# Patient Record
Sex: Male | Born: 1935 | Race: Black or African American | Hispanic: No | Marital: Single | Smoking: Current every day smoker
Health system: Southern US, Community
[De-identification: ages and names within clinical notes are randomized; demographics above are authoritative.]

## PROBLEM LIST (undated history)

## (undated) DIAGNOSIS — I10 Essential (primary) hypertension: Secondary | ICD-10-CM

## (undated) DIAGNOSIS — R55 Syncope and collapse: Secondary | ICD-10-CM

## (undated) DIAGNOSIS — D649 Anemia, unspecified: Secondary | ICD-10-CM

## (undated) DIAGNOSIS — H547 Unspecified visual loss: Secondary | ICD-10-CM

## (undated) DIAGNOSIS — C61 Malignant neoplasm of prostate: Secondary | ICD-10-CM

## (undated) DIAGNOSIS — R918 Other nonspecific abnormal finding of lung field: Secondary | ICD-10-CM

## (undated) DIAGNOSIS — F0390 Unspecified dementia without behavioral disturbance: Secondary | ICD-10-CM

## (undated) DIAGNOSIS — K922 Gastrointestinal hemorrhage, unspecified: Secondary | ICD-10-CM

## (undated) DIAGNOSIS — M6282 Rhabdomyolysis: Secondary | ICD-10-CM

## (undated) DIAGNOSIS — I5032 Chronic diastolic (congestive) heart failure: Secondary | ICD-10-CM

## (undated) DIAGNOSIS — I739 Peripheral vascular disease, unspecified: Secondary | ICD-10-CM

## (undated) DIAGNOSIS — K2970 Gastritis, unspecified, without bleeding: Secondary | ICD-10-CM

## (undated) DIAGNOSIS — N390 Urinary tract infection, site not specified: Secondary | ICD-10-CM

## (undated) DIAGNOSIS — H269 Unspecified cataract: Secondary | ICD-10-CM

## (undated) DIAGNOSIS — Z22322 Carrier or suspected carrier of Methicillin resistant Staphylococcus aureus: Secondary | ICD-10-CM

## (undated) HISTORY — PX: ABDOMINAL SURGERY: SHX537

## (undated) HISTORY — DX: Malignant neoplasm of prostate: C61

## (undated) HISTORY — DX: Anemia, unspecified: D64.9

## (undated) HISTORY — DX: Gastrointestinal hemorrhage, unspecified: K92.2

---

## 2007-12-21 ENCOUNTER — Emergency Department (HOSPITAL_COMMUNITY): Admission: EM | Admit: 2007-12-21 | Discharge: 2007-12-21 | Payer: Self-pay | Admitting: Emergency Medicine

## 2008-08-27 ENCOUNTER — Emergency Department (HOSPITAL_COMMUNITY): Admission: EM | Admit: 2008-08-27 | Discharge: 2008-08-27 | Payer: Self-pay | Admitting: Emergency Medicine

## 2011-08-03 ENCOUNTER — Inpatient Hospital Stay (HOSPITAL_COMMUNITY)
Admission: EM | Admit: 2011-08-03 | Discharge: 2011-08-11 | DRG: 378 | Disposition: A | Discharge status: Nursing Home (Includes ICF) | Payer: Medicare Other | Attending: Family Medicine | Admitting: Family Medicine

## 2011-08-03 ENCOUNTER — Encounter (HOSPITAL_COMMUNITY): Payer: Self-pay | Admitting: Emergency Medicine

## 2011-08-03 DIAGNOSIS — E1169 Type 2 diabetes mellitus with other specified complication: Secondary | ICD-10-CM | POA: Diagnosis present

## 2011-08-03 DIAGNOSIS — F0390 Unspecified dementia without behavioral disturbance: Secondary | ICD-10-CM | POA: Diagnosis present

## 2011-08-03 DIAGNOSIS — K922 Gastrointestinal hemorrhage, unspecified: Secondary | ICD-10-CM | POA: Diagnosis present

## 2011-08-03 DIAGNOSIS — I10 Essential (primary) hypertension: Secondary | ICD-10-CM | POA: Diagnosis present

## 2011-08-03 DIAGNOSIS — E876 Hypokalemia: Secondary | ICD-10-CM | POA: Diagnosis present

## 2011-08-03 DIAGNOSIS — E871 Hypo-osmolality and hyponatremia: Secondary | ICD-10-CM | POA: Diagnosis present

## 2011-08-03 DIAGNOSIS — E119 Type 2 diabetes mellitus without complications: Secondary | ICD-10-CM | POA: Diagnosis present

## 2011-08-03 DIAGNOSIS — N400 Enlarged prostate without lower urinary tract symptoms: Secondary | ICD-10-CM | POA: Diagnosis present

## 2011-08-03 DIAGNOSIS — D62 Acute posthemorrhagic anemia: Secondary | ICD-10-CM | POA: Diagnosis present

## 2011-08-03 DIAGNOSIS — E162 Hypoglycemia, unspecified: Secondary | ICD-10-CM

## 2011-08-03 DIAGNOSIS — H5440 Blindness, one eye, unspecified eye: Secondary | ICD-10-CM | POA: Diagnosis present

## 2011-08-03 DIAGNOSIS — R4182 Altered mental status, unspecified: Secondary | ICD-10-CM | POA: Diagnosis present

## 2011-08-03 DIAGNOSIS — K921 Melena: Principal | ICD-10-CM | POA: Diagnosis present

## 2011-08-03 DIAGNOSIS — D649 Anemia, unspecified: Secondary | ICD-10-CM

## 2011-08-03 HISTORY — DX: Urinary tract infection, site not specified: N39.0

## 2011-08-03 HISTORY — DX: Essential (primary) hypertension: I10

## 2011-08-03 HISTORY — DX: Unspecified dementia, unspecified severity, without behavioral disturbance, psychotic disturbance, mood disturbance, and anxiety: F03.90

## 2011-08-03 HISTORY — DX: Unspecified visual loss: H54.7

## 2011-08-03 HISTORY — DX: Unspecified cataract: H26.9

## 2011-08-03 HISTORY — DX: Gastritis, unspecified, without bleeding: K29.70

## 2011-08-03 HISTORY — DX: Peripheral vascular disease, unspecified: I73.9

## 2011-08-03 HISTORY — DX: Rhabdomyolysis: M62.82

## 2011-08-03 LAB — CBC
HCT: 20.4 % — ABNORMAL LOW (ref 39.0–52.0)
Hemoglobin: 6.3 g/dL — CL (ref 13.0–17.0)
MCH: 22.2 pg — ABNORMAL LOW (ref 26.0–34.0)
MCHC: 30.9 g/dL (ref 30.0–36.0)
MCV: 71.8 fL — ABNORMAL LOW (ref 78.0–100.0)
RDW: 15.1 % (ref 11.5–15.5)

## 2011-08-03 LAB — GLUCOSE, CAPILLARY

## 2011-08-03 LAB — BASIC METABOLIC PANEL
BUN: 15 mg/dL (ref 6–23)
Calcium: 9 mg/dL (ref 8.4–10.5)
Creatinine, Ser: 0.99 mg/dL (ref 0.50–1.35)
GFR calc non Af Amer: 72 mL/min — ABNORMAL LOW (ref >90)
Glucose, Bld: 71 mg/dL (ref 70–99)

## 2011-08-03 LAB — PREPARE RBC (CROSSMATCH)

## 2011-08-03 LAB — RETICULOCYTES: Retic Ct Pct: 1.8 % (ref 0.4–3.1)

## 2011-08-03 MED ORDER — SODIUM CHLORIDE 0.9 % IV BOLUS (SEPSIS)
1000.0000 mL | Freq: Once | INTRAVENOUS | Status: AC
  Administered 2011-08-03: 1000 mL via INTRAVENOUS

## 2011-08-03 MED ORDER — DEXTROSE 5 % IV SOLN
INTRAVENOUS | Status: DC
  Administered 2011-08-03: via INTRAVENOUS

## 2011-08-03 MED ORDER — DEXTROSE 50 % IV SOLN
1.0000 | Freq: Once | INTRAVENOUS | Status: DC
  Filled 2011-08-03: qty 50

## 2011-08-03 MED ORDER — DEXTROSE 50 % IV SOLN
INTRAVENOUS | Status: AC
  Filled 2011-08-03: qty 50

## 2011-08-03 MED ORDER — DEXTROSE 50 % IV SOLN
INTRAVENOUS | Status: AC
  Administered 2011-08-03: 50 mL
  Filled 2011-08-03: qty 50

## 2011-08-03 NOTE — ED Notes (Signed)
Blood transfusion begun.

## 2011-08-03 NOTE — ED Notes (Signed)
Pt. From [facility] was found by EMS with BS of 36.  Pt. Was struggling to get up when staff found him but no one witnessed a fall and patient is having no pain and no evidence of trauma.  EMS gave him 25G of D50.  Pt. Was rechecked and BS was 90.  Pt. More alert according to EMS.

## 2011-08-03 NOTE — ED Provider Notes (Signed)
History     CSN: [id number]  Arrival date & time 08/03/11  1805   First MD Initiated Contact with Patient 08/03/11 1808      Chief Complaint  Patient presents with  . Hypoglycemia    (Consider location/radiation/quality/duration/timing/severity/associated sxs/prior treatment) The history is provided by the patient.   (LEVEL V CAVEAT: dementia) Sent from nursing home for hypoglycemia. On metformin. Ate food today. Does report dark stools x 5-7 days. No abd pain. No vomiting.  No diarrhea. Some SOB over the past several days. No chest pain. No other complaints  Past Medical History  Diagnosis Date  . Hypertension   . Prostate neoplasm   . Angiospasm   . Diabetes mellitus   . Schizoaffective disorder   . Dementia   . Cataracts, bilateral   . Blindness   . Rhabdomyolysis   . Gastritis   . UTI (lower urinary tract infection)     No past surgical history on file.  No family history on file.  History  Substance Use Topics  . Smoking status: Not on file  . Smokeless tobacco: Not on file  . Alcohol Use: No      Review of Systems  Unable to perform ROS   Allergies  Review of patient's allergies indicates no known allergies.  Home Medications   Current Outpatient Rx  Name Route Sig Dispense Refill  . ACETAMINOPHEN 500 MG PO TABS Oral Take 500 mg by mouth 3 (three) times daily.    . ASPIRIN EC 81 MG PO TBEC Oral Take 81 mg by mouth every morning.    Marland Kitchen BUPROPION HCL ER (SR) 150 MG PO TB12 Oral Take 150 mg by mouth every morning.    Marland Kitchen VITAMIN D3 1000 UNITS PO TABS Oral Take 1,000 Units by mouth daily.    Marland Kitchen FINASTERIDE 5 MG PO TABS Oral Take 5 mg by mouth daily. *Do not crush.*    . FLUTICASONE PROPIONATE 50 MCG/ACT NA SUSP Nasal Place 1 spray into the nose every morning.    Marland Kitchen GLYBURIDE 5 MG PO TABS Oral Take 5 mg by mouth daily with breakfast.    . HYDROCERIN EX CREA Topical Apply 1 application topically 2 (two) times daily. To legs and feet. (May keep at bedside.)     . LISINOPRIL-HYDROCHLOROTHIAZIDE 20-12.5 MG PO TABS Oral Take 1 tablet by mouth daily.    . ADULT MULTIVITAMIN W/MINERALS CH Oral Take 1 tablet by mouth every morning.    Marland Kitchen OMEPRAZOLE 40 MG PO CPDR Oral Take 40 mg by mouth every morning.    Marland Kitchen TERAZOSIN HCL 1 MG PO CAPS Oral Take 1 mg by mouth at bedtime.      BP 151/46  Pulse 79  Temp(Src) 98.4 F (36.9 C) (Oral)  Resp 16  SpO2 100%  Physical Exam  Nursing note and vitals reviewed. Constitutional: He is oriented to person, place, and time. He appears well-developed and well-nourished.  HENT:  Head: Normocephalic and atraumatic.  Eyes: EOM are normal.  Neck: Normal range of motion.  Cardiovascular: Normal rate, regular rhythm, normal heart sounds and intact distal pulses.   Pulmonary/Chest: Effort normal and breath sounds normal. No respiratory distress.  Abdominal: Soft. He exhibits no distension. There is no tenderness.  Genitourinary:       Dark brown stool. Normal tone  Musculoskeletal: Normal range of motion.  Neurological: He is alert and oriented to person, place, and time.  Skin: Skin is warm and dry.  Psychiatric: He has a normal  mood and affect. Judgment normal.    ED Course  Procedures (including critical care time)  Labs Reviewed  CBC - Abnormal; Notable for the following:    RBC 2.84 (*)    Hemoglobin 6.3 (*)    HCT 20.4 (*)    MCV 71.8 (*)    MCH 22.2 (*)    All other components within normal limits  BASIC METABOLIC PANEL - Abnormal; Notable for the following:    Sodium 117 (*)    Chloride 84 (*)    GFR calc non Af Amer 72 (*)    GFR calc Af Amer 83 (*)    All other components within normal limits  RETICULOCYTES - Abnormal; Notable for the following:    RBC. 2.80 (*)    All other components within normal limits  GLUCOSE, CAPILLARY - Abnormal; Notable for the following:    Glucose-Capillary 47 (*)    All other components within normal limits  TYPE AND SCREEN  PREPARE RBC (CROSSMATCH)  OCCULT  BLOOD, POC DEVICE  ABO/RH  VITAMIN B12  FOLATE  IRON AND TIBC  FERRITIN   No results found.   1. Hypoglycemia   2. Symptomatic anemia    MDM  Hypoglycemia. Noted to have hgb of 6.3. Some SOB. Will transfuse. No prior hgb's available. Anemia panel sent. No chest pain at this time. Heme positive stools. Spoke with Hospitalist, he will admit. Possible slow UGI bleed        Lyanne Co, MD 08/03/11 2147

## 2011-08-03 NOTE — ED Notes (Signed)
ZOX:[id number]<AV> Expected date:08/03/11<BR> Expected time: 5:56 PM<BR> Means of arrival:Ambulance<BR> Comments:<BR> M90. 88 M. GLF. No injuries or complaint. Stable. 15 mins

## 2011-08-03 NOTE — ED Notes (Signed)
Gave pt ham sandwich and orange juice per RN request

## 2011-08-03 NOTE — ED Notes (Signed)
Critical values reported to Dr Jennefer Bravo. Pt informed of plan of care.

## 2011-08-03 NOTE — ED Notes (Signed)
First contact with pt. Pt alert to person and place. Pt ate complete sandwich. Denies complaints at this time.

## 2011-08-03 NOTE — H&P (Signed)
Michael Yang is an 78 y.o. male.   PCP - Unknown.  Most of the history is obtained from ER physician Dr. Patria Mane. Patient is demented and cannot provide any history. Unable to reach family with the number provided in Epic. Chief Complaint: Hypoglycemia. HPI: 78 year old male with history of hypertension, diabetes mellitus2, dementia, blindness in his right eye prostate cancer was brought from nursing home in [place] due to hypoglycemia. As per the ER physician patient's CBG at the nursing home was around 30. Patient was given something to eat and by the time he reached ER blood sugars were around 72-100. Patient was asymptomatic. While in the ER basic labs done show patient is profoundly anemic with hemoglobin around 6.2 and also hyponatremic sodium around 117. We do not have any old labs to compare with. Stool for occult blood is positive. Patient's blood indices show microcytic hypochromic picture. Patient is also on aspirin. Patient does not provide any history but are specifically questioned if he is hurting anywhere he is stating 'no'. He is not in any acute distress. Patient will be admitted for further management.  Past Medical History  Diagnosis Date  . Hypertension   . Prostate neoplasm   . Angiospasm   . Diabetes mellitus   . Schizoaffective disorder   . Dementia   . Cataracts, bilateral   . Blindness   . Rhabdomyolysis   . Gastritis   . UTI (lower urinary tract infection)     History reviewed. No pertinent past surgical history.  History reviewed. No pertinent family history. Social History:  does not have a smoking history on file. He does not have any smokeless tobacco history on file. He reports that he does not drink alcohol. His drug history not on file.  Allergies: No Known Allergies   (Not in a hospital admission)  Results for orders placed during the hospital encounter of 08/03/11 (from the past 48 hour(s))  CBC     Status: Abnormal   Collection Time   08/03/11   7:15 PM      Component Value Range Comment   WBC 10.5  4.0 - 10.5 (K/uL)    RBC 2.84 (*) 4.22 - 5.81 (MIL/uL)    Hemoglobin 6.3 (*) 13.0 - 17.0 (g/dL)    HCT 16.1 (*) 09.6 - 52.0 (%)    MCV 71.8 (*) 78.0 - 100.0 (fL)    MCH 22.2 (*) 26.0 - 34.0 (pg)    MCHC 30.9  30.0 - 36.0 (g/dL)    RDW 04.5  40.9 - 81.1 (%)    Platelets 393  150 - 400 (K/uL)   BASIC METABOLIC PANEL     Status: Abnormal   Collection Time   08/03/11  7:15 PM      Component Value Range Comment   Sodium 117 (*) 135 - 145 (mEq/L)    Potassium 4.4  3.5 - 5.1 (mEq/L)    Chloride 84 (*) 96 - 112 (mEq/L)    CO2 23  19 - 32 (mEq/L)    Glucose, Bld 71  70 - 99 (mg/dL)    BUN 15  6 - 23 (mg/dL)    Creatinine, Ser 9.14  0.50 - 1.35 (mg/dL)    Calcium 9.0  8.4 - 10.5 (mg/dL)    GFR calc non Af Amer 72 (*) >90 (mL/min)    GFR calc Af Amer 83 (*) >90 (mL/min)   OCCULT BLOOD, POC DEVICE     Status: Normal   Collection Time   08/03/11  8:37 PM      Component Value Range Comment   Fecal Occult Bld POSITIVE     TYPE AND SCREEN     Status: Normal (Preliminary result)   Collection Time   08/03/11  8:42 PM      Component Value Range Comment   ABO/RH(D) O POS      Antibody Screen NEG      Sample Expiration 08/06/2011      Unit Number [id number]      Blood Component Type RBC LR PHER1      Unit division 00      Status of Unit ISSUED      Transfusion Status OK TO TRANSFUSE      Crossmatch Result Compatible      Unit Number [id number]      Blood Component Type RBC LR PHER1      Unit division 00      Status of Unit ALLOCATED      Transfusion Status OK TO TRANSFUSE      Crossmatch Result Compatible     RETICULOCYTES     Status: Abnormal   Collection Time   08/03/11  8:42 PM      Component Value Range Comment   Retic Ct Pct 1.8  0.4 - 3.1 (%)    RBC. 2.80 (*) 4.22 - 5.81 (MIL/uL)    Retic Count, Manual 50.4  19.0 - 186.0 (K/uL)   PREPARE RBC (CROSSMATCH)     Status: Normal   Collection Time   08/03/11  8:45 PM      Component  Value Range Comment   Order Confirmation ORDER PROCESSED BY BLOOD BANK     ABO/RH     Status: Normal (Preliminary result)   Collection Time   08/03/11  8:53 PM      Component Value Range Comment   ABO/RH(D) O POS     GLUCOSE, CAPILLARY     Status: Abnormal   Collection Time   08/03/11  9:41 PM      Component Value Range Comment   Glucose-Capillary 47 (*) 70 - 99 (mg/dL)    Comment 1 Documented in Chart      Comment 2 Notify RN      No results found.  Review of Systems  Constitutional: Negative.   HENT: Negative.   Eyes: Negative.   Respiratory: Negative.   Cardiovascular: Negative.   Gastrointestinal: Negative.   Genitourinary: Negative.   Musculoskeletal: Negative.   Skin: Negative.   Neurological: Negative.   Endo/Heme/Allergies: Negative.   Psychiatric/Behavioral: Negative.     Blood pressure 143/59, pulse 79, temperature 98.3 F (36.8 C), temperature source Oral, resp. rate 18, SpO2 100.00%. Physical Exam  Constitutional: He appears well-developed and well-nourished. No distress.  HENT:  Head: Normocephalic and atraumatic.  Right Ear: External ear normal.  Left Ear: External ear normal.  Nose: Nose normal.  Mouth/Throat: Oropharynx is clear and moist.  Eyes:       Right cornea is opaque.  Neck: Normal range of motion. Neck supple.  Cardiovascular: Normal rate and regular rhythm.   Respiratory: Effort normal and breath sounds normal. No respiratory distress. He has no wheezes. He has no rales.  GI: Soft. Bowel sounds are normal. He exhibits no distension. There is no tenderness. There is no rebound.  Musculoskeletal: Normal range of motion. He exhibits no edema and no tenderness.  Neurological: He is alert.       Oriented to his name only.  Skin: Skin is warm and  dry. No rash noted. He is not diaphoretic. No erythema.     Assessment/Plan #1. Microcytic hypochromic anemia with GI bleed - reviewing patient's blood indices it shows microcytic picture thus his  anemia may be chronic. At this time the ER physician has already ordered 2 units of packed blood cells to be transfused. His stool was brown. Add protonix and recheck CBC after transfusion. Consult GI in a.m. #2. Severe hyponatremia - the causes are clear. Patient is on hydrochlorothiazide which we will discontinue. We will send urine sodium, osmolality both urine and plasma, TSH, cortisol level, check chest x-ray. For now gently hydrate with normal saline. We will have further plans based on his frequent metabolic panel checks and based on his urine sodium and other studies. #3. Hypoglycemia in a patient with diabetes mellitus2 on glyburide - DC glyburide. For now until morning we will check CBG every 2 hours. Based on this if there is no further episodes of hypoglycemia we will change it to a.c. and at bedtime. Check hemoglobin A1c. #4. Dementia - probably patient is at baseline but need to confirm with family when available. #5. History of hypertension - continue lisinopril but hold off HCTZ because of hyponatremia. #6. History of cancer prostate. #7. BPH.  CODE STATUS - full code.  Eduard Clos 08/03/2011, 11:23 PM

## 2011-08-04 ENCOUNTER — Inpatient Hospital Stay (HOSPITAL_COMMUNITY): Payer: Medicare Other

## 2011-08-04 ENCOUNTER — Encounter (HOSPITAL_COMMUNITY): Payer: Self-pay | Admitting: *Deleted

## 2011-08-04 DIAGNOSIS — D649 Anemia, unspecified: Secondary | ICD-10-CM

## 2011-08-04 DIAGNOSIS — K922 Gastrointestinal hemorrhage, unspecified: Secondary | ICD-10-CM

## 2011-08-04 DIAGNOSIS — E162 Hypoglycemia, unspecified: Secondary | ICD-10-CM

## 2011-08-04 LAB — BASIC METABOLIC PANEL
BUN: 10 mg/dL (ref 6–23)
CO2: 21 mEq/L (ref 19–32)
Calcium: 8.6 mg/dL (ref 8.4–10.5)
Chloride: 83 mEq/L — ABNORMAL LOW (ref 96–112)
Chloride: 84 mEq/L — ABNORMAL LOW (ref 96–112)
GFR calc Af Amer: 87 mL/min — ABNORMAL LOW (ref >90)
GFR calc Af Amer: 87 mL/min — ABNORMAL LOW (ref >90)
GFR calc non Af Amer: 75 mL/min — ABNORMAL LOW (ref >90)
GFR calc non Af Amer: 75 mL/min — ABNORMAL LOW (ref >90)
GFR calc non Af Amer: 75 mL/min — ABNORMAL LOW (ref >90)
Glucose, Bld: 105 mg/dL — ABNORMAL HIGH (ref 70–99)
Glucose, Bld: 124 mg/dL — ABNORMAL HIGH (ref 70–99)
Potassium: 3.9 mEq/L (ref 3.5–5.1)
Potassium: 4.1 mEq/L (ref 3.5–5.1)
Potassium: 4.1 mEq/L (ref 3.5–5.1)
Sodium: 117 mEq/L — CL (ref 135–145)
Sodium: 116 mEq/L — CL (ref 135–145)
Sodium: 114 mEq/L — CL (ref 135–145)

## 2011-08-04 LAB — GLUCOSE, CAPILLARY
Glucose-Capillary: 150 mg/dL — ABNORMAL HIGH (ref 70–99)
Glucose-Capillary: 74 mg/dL (ref 70–99)
Glucose-Capillary: 128 mg/dL — ABNORMAL HIGH (ref 70–99)

## 2011-08-04 LAB — CBC
HCT: 23.5 % — ABNORMAL LOW (ref 39.0–52.0)
Hemoglobin: 7.6 g/dL — ABNORMAL LOW (ref 13.0–17.0)
MCH: 24.1 pg — ABNORMAL LOW (ref 26.0–34.0)
MCHC: 32.8 g/dL (ref 30.0–36.0)
MCV: 73.7 fL — ABNORMAL LOW (ref 78.0–100.0)
Platelets: 370 10*3/uL (ref 150–400)
RBC: 3.19 MIL/uL — ABNORMAL LOW (ref 4.22–5.81)
RDW: 16.4 % — ABNORMAL HIGH (ref 11.5–15.5)
WBC: 11.5 10*3/uL — ABNORMAL HIGH (ref 4.0–10.5)

## 2011-08-04 LAB — IRON AND TIBC

## 2011-08-04 LAB — MRSA PCR SCREENING: MRSA by PCR: POSITIVE — AB

## 2011-08-04 LAB — HEMOGLOBIN A1C
Hgb A1c MFr Bld: 4.4 % (ref <5.7)
Mean Plasma Glucose: 80 mg/dL (ref <117)

## 2011-08-04 LAB — FERRITIN: Ferritin: 7 ng/mL — ABNORMAL LOW (ref 22–322)

## 2011-08-04 LAB — CORTISOL: Cortisol, Plasma: 27.6 ug/dL

## 2011-08-04 MED ORDER — MUPIROCIN 2 % EX OINT
1.0000 "application " | TOPICAL_OINTMENT | Freq: Two times a day (BID) | CUTANEOUS | Status: AC
  Administered 2011-08-04 – 2011-08-08 (×10): 1 via NASAL
  Filled 2011-08-04 (×2): qty 22

## 2011-08-04 MED ORDER — PANTOPRAZOLE SODIUM 40 MG PO TBEC: 40.0000 mg | DELAYED_RELEASE_TABLET | Freq: Every day | ORAL | Status: DC

## 2011-08-04 MED ORDER — BUPROPION HCL ER (SR) 150 MG PO TB12
150.0000 mg | ORAL_TABLET | Freq: Every morning | ORAL | Status: DC
  Administered 2011-08-04 – 2011-08-06 (×3): 150 mg via ORAL
  Filled 2011-08-04 (×3): qty 1

## 2011-08-04 MED ORDER — SODIUM CHLORIDE 3 % IV SOLN
INTRAVENOUS | Status: DC
  Administered 2011-08-04: 23:00:00 via INTRAVENOUS
  Administered 2011-08-05: 30 mL/h via INTRAVENOUS
  Filled 2011-08-04 (×3): qty 500

## 2011-08-04 MED ORDER — SODIUM CHLORIDE 0.9 % IV SOLN
INTRAVENOUS | Status: DC
  Administered 2011-08-04: 09:00:00 via INTRAVENOUS

## 2011-08-04 MED ORDER — PANTOPRAZOLE SODIUM 40 MG IV SOLR
40.0000 mg | Freq: Two times a day (BID) | INTRAVENOUS | Status: DC
  Administered 2011-08-04 – 2011-08-07 (×8): 40 mg via INTRAVENOUS
  Filled 2011-08-04 (×9): qty 40

## 2011-08-04 MED ORDER — HYDROCERIN EX CREA
1.0000 "application " | TOPICAL_CREAM | Freq: Two times a day (BID) | CUTANEOUS | Status: DC
  Administered 2011-08-04 – 2011-08-11 (×14): 1 via TOPICAL
  Filled 2011-08-04: qty 113

## 2011-08-04 MED ORDER — CHLORHEXIDINE GLUCONATE CLOTH 2 % EX PADS
6.0000 | MEDICATED_PAD | Freq: Every day | CUTANEOUS | Status: AC
  Administered 2011-08-04 – 2011-08-08 (×5): 6 via TOPICAL

## 2011-08-04 MED ORDER — FINASTERIDE 5 MG PO TABS
5.0000 mg | ORAL_TABLET | Freq: Every day | ORAL | Status: DC
  Administered 2011-08-04 – 2011-08-11 (×8): 5 mg via ORAL
  Filled 2011-08-04 (×8): qty 1

## 2011-08-04 MED ORDER — LISINOPRIL 20 MG PO TABS
20.0000 mg | ORAL_TABLET | Freq: Every day | ORAL | Status: DC
  Administered 2011-08-04 – 2011-08-05 (×2): 20 mg via ORAL
  Filled 2011-08-04 (×2): qty 1

## 2011-08-04 MED ORDER — ACETAMINOPHEN 650 MG RE SUPP: 650.0000 mg | Freq: Four times a day (QID) | RECTAL | Status: DC | PRN

## 2011-08-04 MED ORDER — DEXTROSE 5 % IV SOLN
INTRAVENOUS | Status: AC
  Administered 2011-08-04: via INTRAVENOUS

## 2011-08-04 MED ORDER — PNEUMOCOCCAL VAC POLYVALENT 25 MCG/0.5ML IJ INJ
0.5000 mL | INJECTION | INTRAMUSCULAR | Status: AC
  Administered 2011-08-04: 0.5 mL via INTRAMUSCULAR
  Filled 2011-08-04: qty 0.5

## 2011-08-04 MED ORDER — ONDANSETRON HCL 4 MG/2ML IJ SOLN
4.0000 mg | Freq: Four times a day (QID) | INTRAMUSCULAR | Status: DC | PRN
  Filled 2011-08-04: qty 2

## 2011-08-04 MED ORDER — ACETAMINOPHEN 325 MG PO TABS: 650.0000 mg | ORAL_TABLET | Freq: Four times a day (QID) | ORAL | Status: DC | PRN

## 2011-08-04 MED ORDER — FLUTICASONE PROPIONATE 50 MCG/ACT NA SUSP
1.0000 | Freq: Every morning | NASAL | Status: DC
  Administered 2011-08-04 – 2011-08-11 (×8): 1 via NASAL
  Filled 2011-08-04: qty 16

## 2011-08-04 MED ORDER — SODIUM CHLORIDE 0.9 % IJ SOLN
3.0000 mL | Freq: Two times a day (BID) | INTRAMUSCULAR | Status: DC
  Administered 2011-08-04: 3 mL via INTRAVENOUS
  Administered 2011-08-05: 10:00:00 via INTRAVENOUS
  Administered 2011-08-06 – 2011-08-11 (×10): 3 mL via INTRAVENOUS

## 2011-08-04 MED ORDER — BIOTENE DRY MOUTH MT LIQD
15.0000 mL | Freq: Two times a day (BID) | OROMUCOSAL | Status: DC
Start: 2011-08-04 — End: 2011-08-11
  Administered 2011-08-04 – 2011-08-11 (×13): 15 mL via OROMUCOSAL

## 2011-08-04 MED ORDER — TERAZOSIN HCL 1 MG PO CAPS
1.0000 mg | ORAL_CAPSULE | Freq: Every day | ORAL | Status: DC
  Administered 2011-08-04 – 2011-08-10 (×8): 1 mg via ORAL
  Filled 2011-08-04 (×10): qty 1

## 2011-08-04 MED ORDER — ADULT MULTIVITAMIN W/MINERALS CH
1.0000 | ORAL_TABLET | Freq: Every morning | ORAL | Status: DC
  Administered 2011-08-04 – 2011-08-11 (×8): 1 via ORAL
  Filled 2011-08-04 (×8): qty 1

## 2011-08-04 MED ORDER — INSULIN ASPART 100 UNIT/ML ~~LOC~~ SOLN
0.0000 [IU] | Freq: Three times a day (TID) | SUBCUTANEOUS | Status: DC
Start: 2011-08-04 — End: 2011-08-11
  Administered 2011-08-05: 1 [IU] via SUBCUTANEOUS
  Administered 2011-08-06: 2 [IU] via SUBCUTANEOUS
  Administered 2011-08-10: 1 [IU] via SUBCUTANEOUS

## 2011-08-04 MED ORDER — ONDANSETRON HCL 4 MG PO TABS: 4.0000 mg | ORAL_TABLET | Freq: Four times a day (QID) | ORAL | Status: DC | PRN

## 2011-08-04 NOTE — Progress Notes (Signed)
CRITICAL VALUE ALERT  Critical value received:  Na+ 116 and Urine osmol 244  Date of notification:  08/04/11  Time of notification:  1100  Critical value read back:yes  Nurse who received alert:  K. Vear Clock RN   MD notified (1st page):  Aurelio Brash NP  Time of first page:  On unit 1105  MD notified (2nd page):  Time of second page:  Responding MD:  Aurelio Brash NP  Time MD responded:  [phone]

## 2011-08-04 NOTE — Consult Note (Signed)
Primary Care Physician:   Primary Gastroenterologist:  none  Reason for Consultation:  Microcytic iron deficiency anemia with Hemoccult positive stool  HPI: Michael Yang is a 78 y.o. male who was brought to the emergency room last evening from a nursing facility called [facility] with a glucose of 30. He had been given dextrose in the ambulance and on arrival glucose was within normal range. Patient had baseline labs done in the emergency room however that showed a sodium of 117, and a hemoglobin of 6.2 MCV of 71.8. He was subsequently admitted for further medical management.  Patient has history of dementia, hypertension adult onset diabetes and apparently has prior diagnosis of a schizoaffective disorder and prostate cancer. Patient is appropriate but he is a poor historian. He has no GI complaints currently, specifically denies any abdominal pain nausea vomiting dysphasia. He has been on aware of any melena or hematochezia. He has a midline incisional scar on his abdomen and says that he had surgery at the [facility] in [place] many years ago but is uncertain what this was done for. He cannot tell me whether he has had prior colonoscopy or endoscopy says that he had received care in the past through the [facility].Marland Kitchen  Iron studies have been done showing an iron less than 10 ferritin of 7. He was transfused 2 units of packed RBCs and hemoglobin today is 7.7 his sodium is now 116.  Patient was not on any blood thinners or aspirin prior to admission.   Past Medical History  Diagnosis Date  . Hypertension   . Prostate neoplasm   . Angiospasm   . Diabetes mellitus   . Schizoaffective disorder   . Dementia   . Cataracts, bilateral   . Blindness   . Rhabdomyolysis   . Gastritis   . UTI (lower urinary tract infection)     History reviewed. No pertinent past surgical history.  Prior to Admission medications   Medication Sig Start Date End Date Taking? Authorizing Provider  acetaminophen  (TYLENOL) 500 MG tablet Take 500 mg by mouth 3 (three) times daily.   Yes Historical Provider, MD  aspirin EC 81 MG tablet Take 81 mg by mouth every morning.   Yes Historical Provider, MD  buPROPion (WELLBUTRIN SR) 150 MG 12 hr tablet Take 150 mg by mouth every morning.   Yes Historical Provider, MD  cholecalciferol (VITAMIN D) 1000 UNITS tablet Take 1,000 Units by mouth daily.   Yes Historical Provider, MD  finasteride (PROSCAR) 5 MG tablet Take 5 mg by mouth daily. *Do not crush.*   Yes Historical Provider, MD  fluticasone (FLONASE) 50 MCG/ACT nasal spray Place 1 spray into the nose every morning.   Yes Historical Provider, MD  glyBURIDE (DIABETA) 5 MG tablet Take 5 mg by mouth daily with breakfast.   Yes Historical Provider, MD  hydrocerin (EUCERIN) CREA Apply 1 application topically 2 (two) times daily. To legs and feet. (May keep at bedside.)   Yes Historical Provider, MD  lisinopril-hydrochlorothiazide (PRINZIDE,ZESTORETIC) 20-12.5 MG per tablet Take 1 tablet by mouth daily.   Yes Historical Provider, MD  Multiple Vitamin (MULITIVITAMIN WITH MINERALS) TABS Take 1 tablet by mouth every morning.   Yes Historical Provider, MD  omeprazole (PRILOSEC) 40 MG capsule Take 40 mg by mouth every morning.   Yes Historical Provider, MD  terazosin (HYTRIN) 1 MG capsule Take 1 mg by mouth at bedtime.   Yes Historical Provider, MD    Current Facility-Administered Medications  Medication Dose  Route Frequency Provider Last Rate Last Dose  . 0.9 %  sodium chloride infusion   Intravenous Continuous Eduard Clos, MD 75 mL/hr at 08/04/11 0908    . acetaminophen (TYLENOL) tablet 650 mg  650 mg Oral Q6H PRN Eduard Clos, MD       Or  . acetaminophen (TYLENOL) suppository 650 mg  650 mg Rectal Q6H PRN Eduard Clos, MD      . antiseptic oral rinse (BIOTENE) solution 15 mL  15 mL Mouth Rinse BID Eduard Clos, MD   15 mL at 08/04/11 1041  . buPROPion [facility] SR) 12 hr tablet 150 mg   150 mg Oral q morning - 10a Eduard Clos, MD   150 mg at 08/04/11 1042  . Chlorhexidine Gluconate Cloth 2 % PADS 6 each  6 each Topical Daily Eduard Clos, MD   6 each at 08/04/11 1041  . dextrose 5 % solution   Intravenous STAT Lyanne Co, MD 100 mL/hr at 08/04/11 0001    . dextrose 50 % solution 50 mL  1 ampule Intravenous Once Lyanne Co, MD      . dextrose 50 % solution        50 mL at 08/03/11 2152  . finasteride (PROSCAR) tablet 5 mg  5 mg Oral Daily Eduard Clos, MD   5 mg at 08/04/11 1042  . fluticasone (FLONASE) 50 MCG/ACT nasal spray 1 spray  1 spray Each Nare q morning - 10a Eduard Clos, MD   1 spray at 08/04/11 1000  . hydrocerin (EUCERIN) cream 1 application  1 application Topical BID Eduard Clos, MD   1 application at 08/04/11 1042  . lisinopril (PRINIVIL,ZESTRIL) tablet 20 mg  20 mg Oral Daily Eduard Clos, MD   20 mg at 08/04/11 1043  . mulitivitamin with minerals tablet 1 tablet  1 tablet Oral q morning - 10a Eduard Clos, MD   1 tablet at 08/04/11 1043  . mupirocin ointment (BACTROBAN) 2 % 1 application  1 application Nasal BID Eduard Clos, MD   1 application at 08/04/11 1044  . ondansetron (ZOFRAN) tablet 4 mg  4 mg Oral Q6H PRN Eduard Clos, MD       Or  . ondansetron [facility]) injection 4 mg  4 mg Intravenous Q6H PRN Eduard Clos, MD      . pantoprazole (PROTONIX) injection 40 mg  40 mg Intravenous Q12H Eduard Clos, MD   40 mg at 08/04/11 1044  . pneumococcal 23 valent vaccine (PNU-IMMUNE) injection 0.5 mL  0.5 mL Intramuscular Tomorrow-1000 Eduard Clos, MD   0.5 mL at 08/04/11 1045  . sodium chloride 0.9 % bolus 1,000 mL  1,000 mL Intravenous Once Lyanne Co, MD   1,000 mL at 08/03/11 2037  . sodium chloride 0.9 % injection 3 mL  3 mL Intravenous Q12H Eduard Clos, MD      . terazosin (HYTRIN) capsule 1 mg  1 mg Oral QHS Eduard Clos, MD   1 mg at 08/04/11 0457  .  DISCONTD: dextrose 5 % solution   Intravenous STAT Lyanne Co, MD 100 mL/hr at 08/03/11 2338    . DISCONTD: pantoprazole (PROTONIX) EC tablet 40 mg  40 mg Oral Q1200 Eduard Clos, MD        Allergies as of 08/03/2011  . (No Known Allergies)    History reviewed. No pertinent family history.  History  Social History  . Marital Status: Widowed    Spouse Name: N/A    Number of Children: N/A  . Years of Education: N/A   Occupational History  . Not on file.   Social History Main Topics  . Smoking status: Current Everyday Smoker -- 70 years    Types: Cigarettes  . Smokeless tobacco: Not on file  . Alcohol Use: No  . Drug Use: No  . Sexually Active: No   Other Topics Concern  . Not on file   Social History Narrative  . No narrative on file    Review of Systems: Pertinent positive and negative review of systems were noted in the above HPI section.  All other review of systems was otherwise negative.  Physical Exam: Vital signs in last 24 hours: Temp:  [97.4 F (36.3 C)-99.4 F (37.4 C)] 99.4 F (37.4 C) (04/29 0425) Pulse Rate:  [79-100] 90  (04/29 0425) Resp:  [16-25] 16  (04/29 0425) BP: (130-151)/(46-77) 132/64 mmHg (04/29 0425) SpO2:  [99 %-100 %] 100 % (04/29 0425) Weight:  [155 lb 6.8 oz (70.5 kg)] 155 lb 6.8 oz (70.5 kg) (04/29 0115) Last BM Date: 08/03/11 General:   Alert,  Well-developed, well-nourished, pleasant and cooperative in NAD Head:  Normocephalic and atraumatic. Eyes:  Sclera clear, iris clouded  Conjunctiva pale. Ears:  Normal auditory acuity. Nose:  No deformity, discharge,  or lesions. Mouth:  No deformity or lesions. Dentition very poor  Neck:  Supple; no masses or thyromegaly. Lungs:  Clear throughout to auscultation.   Scattered rhonchi. Heart:  Regular rate and rhythm; no murmurs, clicks, rubs,  or gallops. Distant heart sounds Abdomen:  Soft,nontender, BS active,nonpalp mass or hsm. Long midline incisional scar   Rectal: Not  done stool documented Hemoccult-positive in the emergency room Msk:  Symmetrical without gross deformities. . Pulses:  Normal pulses noted. Extremities:  Without clubbing or edema. Neurologic:  Alert and  oriented x2 grossly normal neurologically decreased visual acuity. Skin:  Intact without significant lesions or rashes.. Psych:  Alert and cooperative. Normal mood and affect. Mentation slow  Intake/Output from previous day: 04/28 0701 - 04/29 0700 In: 3723.3 [P.O.:240; I.V.:3298.3; Blood:175; IV Piggyback:10] Out: 875 [Urine:875] Intake/Output this shift: Total I/O In: 720 [P.O.:720] Out: 450 [Urine:450]  Lab Results:  [facility] 08/04/11 0630 08/03/11 1915  WBC 11.5* 10.5  HGB 7.7* 6.3*  HCT 23.5* 20.4*  PLT 370 393   BMET  Basename 08/04/11 0955 08/04/11 0630 08/03/11 1915  NA 116* 117* 117*  K 4.1 3.9 4.4  CL 83* 86* 84*  CO2 23 21 23   GLUCOSE 117* 105* 71  BUN 10 12 15   CREATININE 0.90 0.89 0.99  CALCIUM 8.3* 8.6 9.0     PT/INR  Basename 08/04/11 0630  LABPROT 14.0  INR 1.06      Studies/Results: Dg Chest 2 View  08/04/2011  *RADIOLOGY REPORT*  Clinical Data: Prostate cancer, cough, congestion.  Smoker.  CHEST - 2 VIEW  Comparison: 08/04/2011  Findings: There are small bilateral pleural effusions.  Bibasilar atelectasis or scarring noted.  Heart is normal size.  Mild interstitial prominence has improved.  IMPRESSION: Small bilateral pleural effusions.  Bibasilar opacities.  I favor this represents atelectasis or scarring.  Original Report Authenticated By: Cyndie Chime, M.D.   Dg Chest Port 1 View  08/04/2011  *RADIOLOGY REPORT*  Clinical Data: Hypertension  PORTABLE CHEST - 1 VIEW  Comparison: None.  Findings: Hyperinflation with coarse interstitial markings. Cardiomegaly.  Aortic arch  atherosclerosis.  Mild opacity along the left heart border.  No pleural effusion or pneumothorax.  No acute osseous abnormality.  IMPRESSION: Interstitial prominence and mild  opacity along the left heart border; atelectasis versus infiltrate. Recommend PA and lateral radiographs when the patient can tolerate  for better characterization.  Original Report Authenticated By: Waneta Martins, M.D.    IMPRESSION:  #6 78 year old male nursing home resident with history of dementia and probable schizoaffective disorder presenting with hypoglycemia.  #2 hyponatremia, etiology not clear no improvement as yet #3 iron deficiency anemia profound with Hemoccult-positive stool. Rule out occult GI malignancy, AVM or other erosive process.  #4 apparent history of prostate CA  #5 diabetes  #6 history of hypertension   PLAN: #1 work up for hyponatremia her medicine service #2 transfuse to keep hemoglobin in the 8 range #3 patient will need colonoscopy and EGD this admission but will need to have correction of his hyponatremia first. Likewise if there are any records available from his nursing home regarding any previous diagnoses, that would be helpful We will follow with you, will advance his diet for today.   Michael Yang  08/04/2011, 10:55 AM

## 2011-08-04 NOTE — ED Notes (Signed)
Pt to floor via stretcher and RN. Pt alert x2. No reaction noted to blood.

## 2011-08-04 NOTE — Progress Notes (Signed)
Report given to [name], RN in ICU. Pt is stable with no current complaints. Tried to call sister- Peyton Najjar. Number in chart was disconnected, updated sister's phone number in chart based off nursing home papers. I called the updated number and sister's answering machine memory is full- unable to leave message. All of the patient's belongings will be transferred down with patient.

## 2011-08-04 NOTE — Progress Notes (Signed)
HPI: 78 year old male with history of hypertension, diabetes mellitus2, dementia, blindness in his right eye prostate cancer was brought from nursing home in [place] on 08/03/11 due to hypoglycemia. As per the ER physician patient's CBG at the nursing home was around 30. Patient was given something to eat and by the time he reached ER blood sugars were around 72-100. Patient was asymptomatic. While in the ER basic labs  Show patient is profoundly anemic with hemoglobin around 6.2 and also hyponatremic sodium around 117. We did not have any old labs to compare with. Stool for occult blood is positive. Patient's blood indices show microcytic hypochromic picture. Patient was also on aspirin. Patient did not provide any history but when specifically questioned if he is hurting anywhere he stated 'no'.    Subjective: Lying in bed alert. Slightly fidgety. Denies pain/discomfort.    Objective: Vital signs Filed Vitals:   08/04/11 0255 08/04/11 0355 08/04/11 0425 08/04/11 1340  BP: 137/70 144/69 132/64 131/62  Pulse: 94 90 90 72  Temp: 99.1 F (37.3 C) 98.8 F (37.1 C) 99.4 F (37.4 C) 98.9 F (37.2 C)  TempSrc: Oral Oral Oral Oral  Resp: 18 16 16 20   Height:      Weight:      SpO2: 99% 99% 100% 99%   Weight change:  Last BM Date: 08/03/11  Intake/Output from previous day: 04/28 0701 - 04/29 0700 In: 3723.3 [P.O.:240; I.V.:3298.3; Blood:175; IV Piggyback:10] Out: 875 [Urine:875] Total I/O In: 1490 [P.O.:960; I.V.:530] Out: 450 [Urine:450]   Physical Exam: General: Alert, awake, oriented to self and place,  in no acute distress. Somewhat fidgety in bed. Cooperative. Well nourished.  HEENT: No bruits, no goiter. Mucus membranes moist. Very poor dentition.  Heart: Regular rate and rhythm, without murmurs, rubs, gallops. Lungs: Normal effort. Breath sounds clear to auscultation bilaterally. No wheeze, rhonchi Abdomen: Soft, nontender, nondistended, positive bowel  sounds. Extremities: No clubbing cyanosis or edema with positive pedal pulses. MOE  Full RON. Joints non-tender to palp Neuro: Grossly intact, nonfocal. Speech slow but clear. Cranial nerve II-XII intact.    Lab Results: Basic Metabolic Panel:  Basename 08/04/11 0955 08/04/11 0630  NA 116* 117*  K 4.1 3.9  CL 83* 86*  CO2 23 21  GLUCOSE 117* 105*  BUN 10 12  CREATININE 0.90 0.89  CALCIUM 8.3* 8.6  MG -- --  PHOS -- --   Liver Function Tests: No results found for this basename: AST:2,ALT:2,ALKPHOS:2,BILITOT:2,PROT:2,ALBUMIN:2 in the last 72 hours No results found for this basename: LIPASE:2,AMYLASE:2 in the last 72 hours No results found for this basename: AMMONIA:2 in the last 72 hours CBC:  Basename 08/04/11 0630 08/03/11 1915  WBC 11.5* 10.5  NEUTROABS -- --  HGB 7.7* 6.3*  HCT 23.5* 20.4*  MCV 73.7* 71.8*  PLT 370 393   Cardiac Enzymes: No results found for this basename: CKTOTAL:3,CKMB:3,CKMBINDEX:3,TROPONINI:3 in the last 72 hours BNP: No results found for this basename: PROBNP:3 in the last 72 hours D-Dimer: No results found for this basename: DDIMER:2 in the last 72 hours CBG:  Basename 08/04/11 1013 08/04/11 0815 08/04/11 0641 08/04/11 0304 08/04/11 0122 08/03/11 2331  GLUCAP 114* 150* 119* 106* 110* 64*   Hemoglobin A1C: No results found for this basename: HGBA1C in the last 72 hours Fasting Lipid Panel: No results found for this basename: CHOL,HDL,LDLCALC,TRIG,CHOLHDL,LDLDIRECT in the last 72 hours Thyroid Function Tests:  Basename 08/04/11 0630  TSH 0.797  T4TOTAL --  FREET4 --  T3FREE --  THYROIDAB --  Anemia Panel:  Basename 08/03/11 2042  VITAMINB12 388  FOLATE >20.0  FERRITIN 7*  TIBC Not calculated due to Iron <10.  IRON <10*  RETICCTPCT 1.8   Coagulation:  Basename 08/04/11 0630  LABPROT 14.0  INR 1.06   Urine Drug Screen: Drugs of Abuse  No results found for this basename: labopia, cocainscrnur, labbenz, amphetmu, thcu,  labbarb    Alcohol Level: No results found for this basename: ETH:2 in the last 72 hours Urinalysis: No results found for this basename: COLORURINE:2,APPERANCEUR:2,LABSPEC:2,PHURINE:2,GLUCOSEU:2,HGBUR:2,BILIRUBINUR:2,KETONESUR:2,PROTEINUR:2,UROBILINOGEN:2,NITRITE:2,LEUKOCYTESUR:2 in the last 72 hours Misc. Labs:  Recent Results (from the past 240 hour(s))  MRSA PCR SCREENING     Status: Abnormal   Collection Time   08/04/11  2:17 AM      Component Value Range Status Comment   MRSA by PCR POSITIVE (*) NEGATIVE  Final     Studies/Results: Dg Chest 2 View  08/04/2011  *RADIOLOGY REPORT*  Clinical Data: Prostate cancer, cough, congestion.  Smoker.  CHEST - 2 VIEW  Comparison: 08/04/2011  Findings: There are small bilateral pleural effusions.  Bibasilar atelectasis or scarring noted.  Heart is normal size.  Mild interstitial prominence has improved.  IMPRESSION: Small bilateral pleural effusions.  Bibasilar opacities.  I favor this represents atelectasis or scarring.  Original Report Authenticated By: Cyndie Chime, M.D.   Dg Chest Port 1 View  08/04/2011  *RADIOLOGY REPORT*  Clinical Data: Hypertension  PORTABLE CHEST - 1 VIEW  Comparison: None.  Findings: Hyperinflation with coarse interstitial markings. Cardiomegaly.  Aortic arch atherosclerosis.  Mild opacity along the left heart border.  No pleural effusion or pneumothorax.  No acute osseous abnormality.  IMPRESSION: Interstitial prominence and mild opacity along the left heart border; atelectasis versus infiltrate. Recommend PA and lateral radiographs when the patient can tolerate  for better characterization.  Original Report Authenticated By: Waneta Martins, M.D.    Medications: Scheduled Meds:   . antiseptic oral rinse  15 mL Mouth Rinse BID  . buPROPion  150 mg Oral q morning - 10a  . Chlorhexidine Gluconate Cloth  6 each Topical Daily  . dextrose   Intravenous STAT  . dextrose  1 ampule Intravenous Once  . dextrose      .  finasteride  5 mg Oral Daily  . fluticasone  1 spray Each Nare q morning - 10a  . hydrocerin  1 application Topical BID  . lisinopril  20 mg Oral Daily  . mulitivitamin with minerals  1 tablet Oral q morning - 10a  . mupirocin ointment  1 application Nasal BID  . pantoprazole (PROTONIX) IV  40 mg Intravenous Q12H  . pneumococcal 23 valent vaccine  0.5 mL Intramuscular Tomorrow-1000  . sodium chloride  1,000 mL Intravenous Once  . sodium chloride  3 mL Intravenous Q12H  . terazosin  1 mg Oral QHS  . DISCONTD: dextrose   Intravenous STAT  . DISCONTD: pantoprazole  40 mg Oral Q1200   Continuous Infusions:   . DISCONTD: sodium chloride Stopped (08/04/11 1258)   PRN Meds:.acetaminophen, acetaminophen, ondansetron (ZOFRAN) IV, ondansetron  Assessment/Plan:  Principal Problem:  *GI bleed Active Problems:  Anemia  Hyponatremia  Diabetes mellitus, type 2  HTN (hypertension)  Dementia   #1. Microcytic hypochromic anemia with GI bleed - reviewing patient's blood indices it shows microcytic picture thus his anemia may be chronic. S/P 2 units PRBC's. Continue protonix and monitor. Appreciate GI input. Will need EGD and colonoscopy once electrolytes normalized.    #2. Severe hyponatremia -  Likely SIADH. Urine osmolality 312, serum osmolality 242. Will discontinue IV, and place on fluid restrictions. Will continue to hold HCTZ. Repeat bmet this afternoon.    #3. Hypoglycemia in a patient with diabetes mellitus 2 on glyburide - DC glyburide. Received amp d50 and D5w in ED. A1c pending. CBG range 106-150. Willl change CBG to  to a.c. and at bedtime.    #4. Dementia - probably patient is at baseline but need to confirm with family when available. rollows commands, cooperative.   #5. History of hypertension - Fair control. Continue lisinopril but hold off HCTZ because of hyponatremia.   #6. History of cancer prostate.   #7. BPH. Stable at baseline    LOS: 1 day   [facility]  M 08/04/2011, 2:05 PM   Patient seen and examined. Agree with above note with changes. Patient denies any pain. Oriented to place but not to anything else. Repeat Bmet and CBC later today. Patient is stable currently.  Lory Galan 2:54 PM

## 2011-08-04 NOTE — Progress Notes (Signed)
Dr Barnie Del called me to ask that hypertonic saline be given.  Chart reviewed, patient has severe hyponatremia with Na of 117 mEq/L and was felt to be 2nd to SIADH.  He was fluid restricted, HCTZ discontinued, and IV NS given.  Despite, his Na dropped even further to 114 mEq/L this evening.  Difficult to assess his mental status as he has impaired baseline mental status.  I agreed with 3 % NaCL infusion, and will ask pharmacist to assist.  He will be transferred to SDU.  When Na is closer to 120, will change over to normal saline.

## 2011-08-04 NOTE — Clinical Social Work Psychosocial (Unsigned)
     Clinical Social Work Department BRIEF PSYCHOSOCIAL ASSESSMENT 08/04/2011  Patient:  [facility]     Account Number:  [id number]     Admit date:  08/03/2011  Clinical Social Worker:  Hattie Perch  Date/Time:  08/04/2011 12:00 M  Referred by:  Physician  Date Referred:  08/04/2011 Referred for  ALF Placement   Other Referral:   Interview type:  Other - See comment Other interview type:   spoke with assisted living staff    PSYCHOSOCIAL DATA Living Status:  FACILITY Admitted from facility:  [facility] Level of care:  Assisted Living Primary support name:  burnice watkins Primary support relationship to patient:  FAMILY Degree of support available:   fair    CURRENT CONCERNS Current Concerns  Post-Acute Placement   Other Concerns:    SOCIAL WORK ASSESSMENT / PLAN [facility] is from [facility]. patient has dementia and is not oriented. patient to return to [facility] unless there is a physical therapy concern.   Assessment/plan status:   Other assessment/ plan:   Information/referral to community resources:    PATIENTS/FAMILYS RESPONSE TO PLAN OF CARE: to return to [facility].

## 2011-08-04 NOTE — Progress Notes (Signed)
   CARE MANAGEMENT NOTE 08/04/2011  Patient:  [facility]   Account Number:  [id number]  Date Initiated:  08/04/2011  Documentation initiated by:  Lanier Clam  Subjective/Objective Assessment:   ADMITTED W/HYPOGLYCEMIA.BJ:[id number],ZHYQMVHQ CA.     Action/Plan:   FROM [facility].   Anticipated DC Date:  08/08/2011   Anticipated DC Plan:  ASSISTED LIVING / REST HOME  In-house referral  Clinical Social Worker         Choice offered to / List presented to:             Status of service:  In process, will continue to follow Medicare Important Message given?   (If response is "NO", the following Medicare IM given date fields will be blank) Date Medicare IM given:   Date Additional Medicare IM given:    Discharge Disposition:    Per UR Regulation:  Reviewed for med. necessity/level of care/duration of stay  If discussed at Long Length of Stay Meetings, dates discussed:    Comments:  08/04/11 [facility] RN,BSN NCM [phone]

## 2011-08-04 NOTE — Progress Notes (Signed)
Patient has dementia and is disoriented. CSW called contact on face sheet, the mailbox was full. CSW spoke with [facility] assisted [facility]. They are agreeable to taking patient back upon discharge. FL-2 completed except for medications which will be entered upon discharge.  Dorrine Montone C. Giovanne Nickolson MSW, LCSW [phone]

## 2011-08-04 NOTE — Progress Notes (Signed)
CRITICAL VALUE ALERT  Critical value received:  Na+ 117  Date of notification:  4/29  Time of notification:  0800  Critical value read back:yes  Nurse who received alert:  K. Vear Clock RN   MD notified (1st page):  Aurelio Brash, NP  Time of first page:  On unit  MD notified (2nd page):  Time of second page:  Responding MD:  Aurelio Brash  Time MD responded:  [phone]

## 2011-08-04 NOTE — Consult Note (Signed)
I have reviewed the above note, examined the patient and agree with plan of treatment.We have a limited history on this gentleman, his labs are suggestive of chronic GIB which could be either upper or lower origin. He has a mils abdominal distention and tympany. Colon neoplasm is a possibility. When his electrolytes stabilize we will proceed with colonoscopy prep and EGD.

## 2011-08-05 ENCOUNTER — Inpatient Hospital Stay (HOSPITAL_COMMUNITY): Payer: Medicare Other

## 2011-08-05 LAB — CBC
HCT: 22.7 % — ABNORMAL LOW (ref 39.0–52.0)
Hemoglobin: 7.2 g/dL — ABNORMAL LOW (ref 13.0–17.0)
MCV: 73.9 fL — ABNORMAL LOW (ref 78.0–100.0)
RBC: 3.07 MIL/uL — ABNORMAL LOW (ref 4.22–5.81)
WBC: 11 10*3/uL — ABNORMAL HIGH (ref 4.0–10.5)

## 2011-08-05 LAB — BASIC METABOLIC PANEL
BUN: 10 mg/dL (ref 6–23)
BUN: 11 mg/dL (ref 6–23)
Calcium: 8.2 mg/dL — ABNORMAL LOW (ref 8.4–10.5)
Calcium: 8.4 mg/dL (ref 8.4–10.5)
Chloride: 88 mEq/L — ABNORMAL LOW (ref 96–112)
Chloride: 89 mEq/L — ABNORMAL LOW (ref 96–112)
Creatinine, Ser: 0.83 mg/dL (ref 0.50–1.35)
Creatinine, Ser: 0.91 mg/dL (ref 0.50–1.35)
Creatinine, Ser: 0.87 mg/dL (ref 0.50–1.35)
GFR calc Af Amer: 89 mL/min — ABNORMAL LOW (ref >90)
GFR calc Af Amer: 90 mL/min — ABNORMAL LOW (ref >90)
GFR calc Af Amer: 90 mL/min — ABNORMAL LOW (ref >90)
GFR calc Af Amer: 86 mL/min — ABNORMAL LOW (ref >90)
GFR calc Af Amer: 88 mL/min — ABNORMAL LOW (ref >90)
GFR calc non Af Amer: 77 mL/min — ABNORMAL LOW (ref >90)
GFR calc non Af Amer: 77 mL/min — ABNORMAL LOW (ref >90)
GFR calc non Af Amer: 75 mL/min — ABNORMAL LOW (ref >90)
Glucose, Bld: 107 mg/dL — ABNORMAL HIGH (ref 70–99)
Potassium: 3.9 mEq/L (ref 3.5–5.1)
Potassium: 3.9 mEq/L (ref 3.5–5.1)
Sodium: 114 mEq/L — CL (ref 135–145)
Sodium: 116 mEq/L — CL (ref 135–145)

## 2011-08-05 LAB — GLUCOSE, CAPILLARY
Glucose-Capillary: 120 mg/dL — ABNORMAL HIGH (ref 70–99)
Glucose-Capillary: 85 mg/dL (ref 70–99)

## 2011-08-05 MED ORDER — HYDRALAZINE HCL 25 MG PO TABS
25.0000 mg | ORAL_TABLET | Freq: Two times a day (BID) | ORAL | Status: DC
  Administered 2011-08-05 – 2011-08-11 (×12): 25 mg via ORAL
  Filled 2011-08-05 (×14): qty 1

## 2011-08-05 MED ORDER — DEXTROSE 5 % IV SOLN
1.0000 g | INTRAVENOUS | Status: DC
  Administered 2011-08-05 – 2011-08-07 (×3): 1 g via INTRAVENOUS
  Filled 2011-08-05 (×3): qty 10

## 2011-08-05 MED ORDER — SODIUM CHLORIDE 3 % IV SOLN
INTRAVENOUS | Status: DC
  Filled 2011-08-05: qty 500

## 2011-08-05 MED ORDER — DEXTROSE 5 % IV SOLN
500.0000 mg | INTRAVENOUS | Status: DC
  Administered 2011-08-05 – 2011-08-08 (×4): 500 mg via INTRAVENOUS
  Filled 2011-08-05 (×4): qty 500

## 2011-08-05 MED ORDER — FUROSEMIDE 10 MG/ML IJ SOLN
20.0000 mg | Freq: Once | INTRAMUSCULAR | Status: AC
  Administered 2011-08-05: 20 mg via INTRAVENOUS
  Filled 2011-08-05: qty 2

## 2011-08-05 NOTE — Progress Notes (Addendum)
PCP: Florentina Jenny, MD, MD  Brief HPI:  78 year old male with history of hypertension, diabetes mellitus2, dementia, blindness in his right eye prostate cancer was brought from nursing home in [place] on 08/03/11 due to hypoglycemia. As per the ER physician patient's CBG at the nursing home was around 30. Patient was given something to eat and by the time he reached ER blood sugars were around 72-100. Patient was asymptomatic. While in the ER basic labs Show patient is profoundly anemic with hemoglobin around 6.2 and also hyponatremic sodium around 117. We did not have any old labs to compare with. Stool for occult blood is positive. Patient's blood indices show microcytic hypochromic picture. Patient was also on aspirin. Patient did not provide any history but when specifically questioned if he is hurting anywhere he stated 'no'.   Past medical history:  Past Medical History  Diagnosis Date  . Hypertension   . Prostate neoplasm   . Angiospasm   . Diabetes mellitus   . Schizoaffective disorder   . Dementia   . Cataracts, bilateral   . Blindness   . Rhabdomyolysis   . Gastritis   . UTI (lower urinary tract infection)     Consultants: Nephrology called  Procedures: None  Subjective: Patient is coughing. Continues to deny pain. Disoriented.  Objective: Vital signs in last 24 hours: Temp:  [98.5 F (36.9 C)-98.9 F (37.2 C)] 98.6 F (37 C) (04/30 0400) Pulse Rate:  [59-94] 59  (04/30 0700) Resp:  [13-23] 17  (04/30 0700) BP: (90-142)/(26-74) 118/74 mmHg (04/30 0700) SpO2:  [86 %-99 %] 86 % (04/30 0700) Weight:  [72.1 kg (158 lb 15.2 oz)] 72.1 kg (158 lb 15.2 oz) (04/30 0000) Weight change: 1.6 kg (3 lb 8.4 oz) Last BM Date: 08/03/11  Intake/Output from previous day: 04/29 0701 - 04/30 0700 In: 2500 [P.O.:1800; I.V.:690; IV Piggyback:10] Out: 1550 [Urine:1550] Intake/Output this shift: Total I/O In: -  Out: 175 [Urine:175]  General appearance: alert, cooperative and  no distress Head: Normocephalic, without obvious abnormality, atraumatic Throat: lips, mucosa, and tongue normal; teeth and gums normal Back: symmetric, no curvature. ROM normal. No CVA tenderness. Resp: few crackles at right base, no wheezing. Cardio: regular rate and rhythm, S1, S2 normal, no murmur, click, rub or gallop GI: soft, non-tender; bowel sounds normal; no masses,  no organomegaly Extremities: extremities normal, atraumatic, no cyanosis or edema Pulses: 2+ and symmetric Skin: Skin color, texture, turgor normal. No rashes or lesions Neurologic: Alert, Disoriented. No focal deficits.  Lab Results:  [facility] 08/05/11 0416 08/04/11 1808  WBC 11.0* 12.4*  HGB 7.2* 7.6*  HCT 22.7* 23.5*  PLT 336 310   BMET  Basename 08/05/11 0416 08/05/11 0013  NA 116* 114*  K 3.9 4.0  CL 85* 85*  CO2 23 23  GLUCOSE 97 107*  BUN 10 11  CREATININE 0.83 0.85  CALCIUM 8.4 8.2*  ALT -- --    Studies/Results: Dg Chest 2 View  08/04/2011  *RADIOLOGY REPORT*  Clinical Data: Prostate cancer, cough, congestion.  Smoker.  CHEST - 2 VIEW  Comparison: 08/04/2011  Findings: There are small bilateral pleural effusions.  Bibasilar atelectasis or scarring noted.  Heart is normal size.  Mild interstitial prominence has improved.  IMPRESSION: Small bilateral pleural effusions.  Bibasilar opacities.  I favor this represents atelectasis or scarring.  Original Report Authenticated By: Cyndie Chime, M.D.   Dg Chest Port 1 View  08/04/2011  *RADIOLOGY REPORT*  Clinical Data: Hypertension  PORTABLE CHEST - 1  VIEW  Comparison: None.  Findings: Hyperinflation with coarse interstitial markings. Cardiomegaly.  Aortic arch atherosclerosis.  Mild opacity along the left heart border.  No pleural effusion or pneumothorax.  No acute osseous abnormality.  IMPRESSION: Interstitial prominence and mild opacity along the left heart border; atelectasis versus infiltrate. Recommend PA and lateral radiographs when the patient  can tolerate  for better characterization.  Original Report Authenticated By: Waneta Martins, M.D.    Medications:  Scheduled:   . antiseptic oral rinse  15 mL Mouth Rinse BID  . buPROPion  150 mg Oral q morning - 10a  . Chlorhexidine Gluconate Cloth  6 each Topical Daily  . dextrose   Intravenous STAT  . dextrose  1 ampule Intravenous Once  . finasteride  5 mg Oral Daily  . fluticasone  1 spray Each Nare q morning - 10a  . hydrocerin  1 application Topical BID  . insulin aspart  0-9 Units Subcutaneous TID WC  . lisinopril  20 mg Oral Daily  . mulitivitamin with minerals  1 tablet Oral q morning - 10a  . mupirocin ointment  1 application Nasal BID  . pantoprazole (PROTONIX) IV  40 mg Intravenous Q12H  . pneumococcal 23 valent vaccine  0.5 mL Intramuscular Tomorrow-1000  . sodium chloride  3 mL Intravenous Q12H  . terazosin  1 mg Oral QHS    Assessment/Plan:  Principal Problem:  *GI bleed Active Problems:  Anemia  Hyponatremia  Diabetes mellitus, type 2  HTN (hypertension)  Dementia    Microcytic Anemia with acute blood loss Has been transfused 2 units. Will transfuse 1 more unit. Anemia panel suggests iron deficiency. Will need iron replacement.  Possible GI Bleed Appreciate GI input. Will need TCS/EGD once electrolytes are improved. Continue PPI.  Severe hyponatremia  Likely SIADH based on Urine Osm and Sodium. With fluid restriction the urine sodium levels dropped further. Patient was transferred to SDU and started on hypertonic saline. TSH is normal. Etiology for hyponatremia remains unclear. Will get Nephrology input as well. Increase rate of 3% saline. Will continue to hold HCTZ.  Cough Possible aspiration. Repeat CXR. The one's from admission did not show any infiltrate. Swallow evaluation.  Hypoglycemia in a patient with diabetes mellitus 2 while on glyburide Glyburide discontinued. A1c is 4.4. Patient doesn't need to be on medications. Would recommend  monitoring blood sugars at SNF periodically off diabetic medications.   Dementia Probably patient is at baseline but not certain. No family available. Follows commands, cooperative.   History of hypertension Fair control. Continue lisinopril but hold off HCTZ because of hyponatremia.   History of Prostate Cancer and BPH Stable. Continue Finasteride.  Full Code  DVT Prophylaxis SCD's    LOS: 2 days   [facility]  [facility] Pager [phone] 08/05/2011, 7:59 AM  CXR shows possible infiltrate. Will start antibiotics. Continue to monitor.  Jami Ohlin 12:58 PM

## 2011-08-05 NOTE — Progress Notes (Signed)
CSW following for return to ALF. CSW placed FL2 on chart for signatures.  Vennie Homans, [place] 08/05/2011 9:56 AM [phone]

## 2011-08-05 NOTE — Consult Note (Signed)
Reason for Consult: Hyponatremia- unclear duration Referring Physician: Osvaldo Shipper MD, [facility]  Michael Yang is an 78 y.o. male.  HPI: 78 year old African American man with history of hypertension, type-2 diabetes mellitus, dementia, blindness in his right eye and prostate cancer who was brought from his nursing home on 08/03/11 for hypoglycemia (patient's CBG at the nursing home was around 30 and improved to 72-100 after PO intake). In the emergency room, the patient's labs consequently showed that he was severely anemic and had hyponatremia prompting admission. The initial suspicion was that his hyponatremia was from hydrochlorothiazide/volume depletion and intravenous saline resuscitation was attempted however, this led to a drop of his sodium from 117 to 114, increasing suspicion for SIADH. In retrospect, urine osmolality was noted to be 312 with a urine sodium of 97 (likely from preceding hydrochlorothiazide use). Unfortunately, no further labs are available for the patient and he is unable to contribute further to his history. No preceding GI losses noted.    Past Medical History  Diagnosis Date  . Hypertension   . Prostate neoplasm   . Angiospasm   . Diabetes mellitus   . Schizoaffective disorder   . Dementia   . Cataracts, bilateral   . Blindness   . Rhabdomyolysis   . Gastritis   . UTI (lower urinary tract infection)     History reviewed. No pertinent past surgical history.  History reviewed. No pertinent family history.  Social History:  reports that he has been smoking Cigarettes.  He has smoked for the past 70 years. He does not have any smokeless tobacco history on file. He reports that he does not drink alcohol or use illicit drugs.  Allergies: No Known Allergies  Medications:  Scheduled:   . antiseptic oral rinse  15 mL Mouth Rinse BID  . azithromycin  500 mg Intravenous Q24H  . buPROPion  150 mg Oral q morning - 10a  . cefTRIAXone (ROCEPHIN)  IV   1 g Intravenous Q24H  . Chlorhexidine Gluconate Cloth  6 each Topical Daily  . dextrose  1 ampule Intravenous Once  . finasteride  5 mg Oral Daily  . fluticasone  1 spray Each Nare q morning - 10a  . furosemide  20 mg Intravenous Once  . hydrocerin  1 application Topical BID  . insulin aspart  0-9 Units Subcutaneous TID WC  . lisinopril  20 mg Oral Daily  . mulitivitamin with minerals  1 tablet Oral q morning - 10a  . mupirocin ointment  1 application Nasal BID  . pantoprazole (PROTONIX) IV  40 mg Intravenous Q12H  . sodium chloride  3 mL Intravenous Q12H  . terazosin  1 mg Oral QHS    Results for orders placed during the hospital encounter of 08/03/11 (from the past 48 hour(s))  CBC     Status: Abnormal   Collection Time   08/03/11  7:15 PM      Component Value Range Comment   WBC 10.5  4.0 - 10.5 (K/uL)    RBC 2.84 (*) 4.22 - 5.81 (MIL/uL)    Hemoglobin 6.3 (*) 13.0 - 17.0 (g/dL)    HCT 16.1 (*) 09.6 - 52.0 (%)    MCV 71.8 (*) 78.0 - 100.0 (fL)    MCH 22.2 (*) 26.0 - 34.0 (pg)    MCHC 30.9  30.0 - 36.0 (g/dL)    RDW 04.5  40.9 - 81.1 (%)    Platelets 393  150 - 400 (K/uL)   BASIC METABOLIC PANEL  Status: Abnormal   Collection Time   08/03/11  7:15 PM      Component Value Range Comment   Sodium 117 (*) 135 - 145 (mEq/L)    Potassium 4.4  3.5 - 5.1 (mEq/L)    Chloride 84 (*) 96 - 112 (mEq/L)    CO2 23  19 - 32 (mEq/L)    Glucose, Bld 71  70 - 99 (mg/dL)    BUN 15  6 - 23 (mg/dL)    Creatinine, Ser 1.61  0.50 - 1.35 (mg/dL)    Calcium 9.0  8.4 - 10.5 (mg/dL)    GFR calc non Af Amer 72 (*) >90 (mL/min)    GFR calc Af Amer 83 (*) >90 (mL/min)   OCCULT BLOOD, POC DEVICE     Status: Normal   Collection Time   08/03/11  8:37 PM      Component Value Range Comment   Fecal Occult Bld POSITIVE     TYPE AND SCREEN     Status: Normal (Preliminary result)   Collection Time   08/03/11  8:42 PM      Component Value Range Comment   ABO/RH(D) O POS      Antibody Screen NEG       Sample Expiration 08/06/2011      Unit Number [id number]      Blood Component Type RBC LR PHER1      Unit division 00      Status of Unit ISSUED,FINAL      Transfusion Status OK TO TRANSFUSE      Crossmatch Result Compatible      Unit Number [id number]      Blood Component Type RBC LR PHER1      Unit division 00      Status of Unit ISSUED,FINAL      Transfusion Status OK TO TRANSFUSE      Crossmatch Result Compatible      Unit Number [id number]      Blood Component Type RED CELLS,LR      Unit division 00      Status of Unit ISSUED      Transfusion Status OK TO TRANSFUSE      Crossmatch Result Compatible     VITAMIN B12     Status: Normal   Collection Time   08/03/11  8:42 PM      Component Value Range Comment   Vitamin B-12 388  211 - 911 (pg/mL)   FOLATE     Status: Normal   Collection Time   08/03/11  8:42 PM      Component Value Range Comment   Folate >20.0     IRON AND TIBC     Status: Abnormal   Collection Time   08/03/11  8:42 PM      Component Value Range Comment   Iron <10 (*) 42 - 135 (ug/dL)    TIBC Not calculated due to Iron <10.  215 - 435 (ug/dL)    Saturation Ratios Not calculated due to Iron <10.  20 - 55 (%)    UIBC 512 (*) 125 - 400 (ug/dL)   FERRITIN     Status: Abnormal   Collection Time   08/03/11  8:42 PM      Component Value Range Comment   Ferritin 7 (*) 22 - 322 (ng/mL)   RETICULOCYTES     Status: Abnormal   Collection Time   08/03/11  8:42 PM      Component Value Range Comment  Retic Ct Pct 1.8  0.4 - 3.1 (%)    RBC. 2.80 (*) 4.22 - 5.81 (MIL/uL)    Retic Count, Manual 50.4  19.0 - 186.0 (K/uL)   PREPARE RBC (CROSSMATCH)     Status: Normal   Collection Time   08/03/11  8:45 PM      Component Value Range Comment   Order Confirmation ORDER PROCESSED BY BLOOD BANK     ABO/RH     Status: Normal   Collection Time   08/03/11  8:53 PM      Component Value Range Comment   ABO/RH(D) O POS     GLUCOSE, CAPILLARY     Status: Abnormal   Collection Time     08/03/11  9:41 PM      Component Value Range Comment   Glucose-Capillary 47 (*) 70 - 99 (mg/dL)    Comment 1 Documented in Chart      Comment 2 Notify RN     GLUCOSE, CAPILLARY     Status: Abnormal   Collection Time   08/03/11 11:31 PM      Component Value Range Comment   Glucose-Capillary 64 (*) 70 - 99 (mg/dL)    Comment 1 Documented in Chart      Comment 2 Notify RN     SODIUM, URINE, RANDOM     Status: Normal   Collection Time   08/04/11 12:39 AM      Component Value Range Comment   Sodium, Ur 95     OSMOLALITY, URINE     Status: Abnormal   Collection Time   08/04/11 12:40 AM      Component Value Range Comment   Osmolality, Ur 312 (*) 390 - 1090 (mOsm/kg)   GLUCOSE, CAPILLARY     Status: Abnormal   Collection Time   08/04/11  1:22 AM      Component Value Range Comment   Glucose-Capillary 110 (*) 70 - 99 (mg/dL)    Comment 1 Notify RN     MRSA PCR SCREENING     Status: Abnormal   Collection Time   08/04/11  2:17 AM      Component Value Range Comment   MRSA by PCR POSITIVE (*) NEGATIVE    GLUCOSE, CAPILLARY     Status: Abnormal   Collection Time   08/04/11  3:04 AM      Component Value Range Comment   Glucose-Capillary 106 (*) 70 - 99 (mg/dL)    Comment 1 Notify RN     TSH     Status: Normal   Collection Time   08/04/11  6:30 AM      Component Value Range Comment   TSH 0.797  0.350 - 4.500 (uIU/mL)   CORTISOL     Status: Normal   Collection Time   08/04/11  6:30 AM      Component Value Range Comment   Cortisol, Plasma 27.6     HEMOGLOBIN A1C     Status: Normal   Collection Time   08/04/11  6:30 AM      Component Value Range Comment   Hemoglobin A1C 4.4  <5.7 (%)    Mean Plasma Glucose 80  <117 (mg/dL)   CBC     Status: Abnormal   Collection Time   08/04/11  6:30 AM      Component Value Range Comment   WBC 11.5 (*) 4.0 - 10.5 (K/uL)    RBC 3.19 (*) 4.22 - 5.81 (MIL/uL)    Hemoglobin 7.7 (*)  13.0 - 17.0 (g/dL)    HCT 46.9 (*) 62.9 - 52.0 (%)    MCV 73.7 (*) 78.0  - 100.0 (fL)    MCH 24.1 (*) 26.0 - 34.0 (pg)    MCHC 32.8  30.0 - 36.0 (g/dL)    RDW 52.8 (*) 41.3 - 15.5 (%)    Platelets 370  150 - 400 (K/uL)   PROTIME-INR     Status: Normal   Collection Time   08/04/11  6:30 AM      Component Value Range Comment   Prothrombin Time 14.0  11.6 - 15.2 (seconds)    INR 1.06  0.00 - 1.49    OSMOLALITY     Status: Abnormal   Collection Time   08/04/11  6:30 AM      Component Value Range Comment   Osmolality 242 (*) 275 - 300 (mOsm/kg)   BASIC METABOLIC PANEL     Status: Abnormal   Collection Time   08/04/11  6:30 AM      Component Value Range Comment   Sodium 117 (*) 135 - 145 (mEq/L)    Potassium 3.9  3.5 - 5.1 (mEq/L)    Chloride 86 (*) 96 - 112 (mEq/L)    CO2 21  19 - 32 (mEq/L)    Glucose, Bld 105 (*) 70 - 99 (mg/dL)    BUN 12  6 - 23 (mg/dL)    Creatinine, Ser 2.44  0.50 - 1.35 (mg/dL)    Calcium 8.6  8.4 - 10.5 (mg/dL)    GFR calc non Af Amer 75 (*) >90 (mL/min)    GFR calc Af Amer 87 (*) >90 (mL/min)   GLUCOSE, CAPILLARY     Status: Abnormal   Collection Time   08/04/11  6:41 AM      Component Value Range Comment   Glucose-Capillary 119 (*) 70 - 99 (mg/dL)    Comment 1 Notify RN     GLUCOSE, CAPILLARY     Status: Abnormal   Collection Time   08/04/11  8:15 AM      Component Value Range Comment   Glucose-Capillary 150 (*) 70 - 99 (mg/dL)    Comment 1 Notify RN      Comment 2 Documented in Chart     BASIC METABOLIC PANEL     Status: Abnormal   Collection Time   08/04/11  9:55 AM      Component Value Range Comment   Sodium 116 (*) 135 - 145 (mEq/L)    Potassium 4.1  3.5 - 5.1 (mEq/L)    Chloride 83 (*) 96 - 112 (mEq/L)    CO2 23  19 - 32 (mEq/L)    Glucose, Bld 117 (*) 70 - 99 (mg/dL)    BUN 10  6 - 23 (mg/dL)    Creatinine, Ser 0.10  0.50 - 1.35 (mg/dL)    Calcium 8.3 (*) 8.4 - 10.5 (mg/dL)    GFR calc non Af Amer 75 (*) >90 (mL/min)    GFR calc Af Amer 87 (*) >90 (mL/min)   GLUCOSE, CAPILLARY     Status: Abnormal   Collection  Time   08/04/11 10:13 AM      Component Value Range Comment   Glucose-Capillary 114 (*) 70 - 99 (mg/dL)    Comment 1 Notify RN      Comment 2 Documented in Chart     GLUCOSE, CAPILLARY     Status: Abnormal   Collection Time   08/04/11 12:08 PM  Component Value Range Comment   Glucose-Capillary 135 (*) 70 - 99 (mg/dL)    Comment 1 Notify RN      Comment 2 Documented in Chart     GLUCOSE, CAPILLARY     Status: Normal   Collection Time   08/04/11  4:38 PM      Component Value Range Comment   Glucose-Capillary 74  70 - 99 (mg/dL)    Comment 1 Notify RN      Comment 2 Documented in Chart     BASIC METABOLIC PANEL     Status: Abnormal   Collection Time   08/04/11  6:08 PM      Component Value Range Comment   Sodium 114 (*) 135 - 145 (mEq/L)    Potassium 4.1  3.5 - 5.1 (mEq/L)    Chloride 84 (*) 96 - 112 (mEq/L)    CO2 24  19 - 32 (mEq/L)    Glucose, Bld 124 (*) 70 - 99 (mg/dL)    BUN 10  6 - 23 (mg/dL)    Creatinine, Ser 4.09  0.50 - 1.35 (mg/dL)    Calcium 8.2 (*) 8.4 - 10.5 (mg/dL)    GFR calc non Af Amer 75 (*) >90 (mL/min)    GFR calc Af Amer 87 (*) >90 (mL/min)   CBC     Status: Abnormal   Collection Time   08/04/11  6:08 PM      Component Value Range Comment   WBC 12.4 (*) 4.0 - 10.5 (K/uL)    RBC 3.19 (*) 4.22 - 5.81 (MIL/uL)    Hemoglobin 7.6 (*) 13.0 - 17.0 (g/dL)    HCT 81.1 (*) 91.4 - 52.0 (%)    MCV 73.7 (*) 78.0 - 100.0 (fL)    MCH 23.8 (*) 26.0 - 34.0 (pg)    MCHC 32.3  30.0 - 36.0 (g/dL)    RDW 78.2 (*) 95.6 - 15.5 (%)    Platelets 310  150 - 400 (K/uL)   GLUCOSE, CAPILLARY     Status: Abnormal   Collection Time   08/04/11 10:21 PM      Component Value Range Comment   Glucose-Capillary 128 (*) 70 - 99 (mg/dL)    Comment 1 Notify RN     BASIC METABOLIC PANEL     Status: Abnormal   Collection Time   08/05/11 12:13 AM      Component Value Range Comment   Sodium 114 (*) 135 - 145 (mEq/L)    Potassium 4.0  3.5 - 5.1 (mEq/L)    Chloride 85 (*) 96 - 112  (mEq/L)    CO2 23  19 - 32 (mEq/L)    Glucose, Bld 107 (*) 70 - 99 (mg/dL)    BUN 11  6 - 23 (mg/dL)    Creatinine, Ser 2.13  0.50 - 1.35 (mg/dL)    Calcium 8.2 (*) 8.4 - 10.5 (mg/dL)    GFR calc non Af Amer 77 (*) >90 (mL/min)    GFR calc Af Amer 89 (*) >90 (mL/min)   CBC     Status: Abnormal   Collection Time   08/05/11  4:16 AM      Component Value Range Comment   WBC 11.0 (*) 4.0 - 10.5 (K/uL)    RBC 3.07 (*) 4.22 - 5.81 (MIL/uL)    Hemoglobin 7.2 (*) 13.0 - 17.0 (g/dL)    HCT 08.6 (*) 57.8 - 52.0 (%)    MCV 73.9 (*) 78.0 - 100.0 (fL)  MCH 23.5 (*) 26.0 - 34.0 (pg)    MCHC 31.7  30.0 - 36.0 (g/dL)    RDW 78.4 (*) 69.6 - 15.5 (%)    Platelets 336  150 - 400 (K/uL)   BASIC METABOLIC PANEL     Status: Abnormal   Collection Time   08/05/11  4:16 AM      Component Value Range Comment   Sodium 116 (*) 135 - 145 (mEq/L)    Potassium 3.9  3.5 - 5.1 (mEq/L)    Chloride 85 (*) 96 - 112 (mEq/L)    CO2 23  19 - 32 (mEq/L)    Glucose, Bld 97  70 - 99 (mg/dL)    BUN 10  6 - 23 (mg/dL)    Creatinine, Ser 2.95  0.50 - 1.35 (mg/dL)    Calcium 8.4  8.4 - 10.5 (mg/dL)    GFR calc non Af Amer 77 (*) >90 (mL/min)    GFR calc Af Amer 90 (*) >90 (mL/min)   BASIC METABOLIC PANEL     Status: Abnormal   Collection Time   08/05/11  8:00 AM      Component Value Range Comment   Sodium 118 (*) 135 - 145 (mEq/L)    Potassium 3.8  3.5 - 5.1 (mEq/L)    Chloride 88 (*) 96 - 112 (mEq/L)    CO2 24  19 - 32 (mEq/L)    Glucose, Bld 121 (*) 70 - 99 (mg/dL)    BUN 9  6 - 23 (mg/dL)    Creatinine, Ser 2.84  0.50 - 1.35 (mg/dL)    Calcium 8.3 (*) 8.4 - 10.5 (mg/dL)    GFR calc non Af Amer 77 (*) >90 (mL/min)    GFR calc Af Amer 90 (*) >90 (mL/min)   PREPARE RBC (CROSSMATCH)     Status: Normal   Collection Time   08/05/11  8:01 AM      Component Value Range Comment   Order Confirmation ORDER PROCESSED BY BLOOD BANK     GLUCOSE, CAPILLARY     Status: Normal   Collection Time   08/05/11  8:11 AM       Component Value Range Comment   Glucose-Capillary 96  70 - 99 (mg/dL)   GLUCOSE, CAPILLARY     Status: Abnormal   Collection Time   08/05/11 12:25 PM      Component Value Range Comment   Glucose-Capillary 142 (*) 70 - 99 (mg/dL)    Comment 1 Notify RN       Dg Chest 2 View  08/04/2011  *RADIOLOGY REPORT*  Clinical Data: Prostate cancer, cough, congestion.  Smoker.  CHEST - 2 VIEW  Comparison: 08/04/2011  Findings: There are small bilateral pleural effusions.  Bibasilar atelectasis or scarring noted.  Heart is normal size.  Mild interstitial prominence has improved.  IMPRESSION: Small bilateral pleural effusions.  Bibasilar opacities.  I favor this represents atelectasis or scarring.  Original Report Authenticated By: Cyndie Chime, M.D.   Dg Chest Port 1 View  08/05/2011  *RADIOLOGY REPORT*  Clinical Data: Cough, hypertension  PORTABLE CHEST - 1 VIEW  Comparison: 08/04/2011  Findings: Cardiomediastinal silhouette is stable.  There is poor inspiration.  No convincing pulmonary edema.  Persistent small bilateral pleural effusion with streaky bilateral basilar atelectasis or infiltrate.  IMPRESSION: Poor inspiration.  Small bilateral pleural effusion with streaky bilateral basilar atelectasis or infiltrate.  No pulmonary edema.  Original Report Authenticated By: Natasha Mead, M.D.   Dg Chest [facility]  1 View  08/04/2011  *RADIOLOGY REPORT*  Clinical Data: Hypertension  PORTABLE CHEST - 1 VIEW  Comparison: None.  Findings: Hyperinflation with coarse interstitial markings. Cardiomegaly.  Aortic arch atherosclerosis.  Mild opacity along the left heart border.  No pleural effusion or pneumothorax.  No acute osseous abnormality.  IMPRESSION: Interstitial prominence and mild opacity along the left heart border; atelectasis versus infiltrate. Recommend PA and lateral radiographs when the patient can tolerate  for better characterization.  Original Report Authenticated By: Waneta Martins, M.D.    Review of  Systems  Unable to perform ROS: dementia   Blood pressure 134/68, pulse 76, temperature 98.5 F (36.9 C), temperature source Oral, resp. rate 16, height 5\' 7"  (1.702 m), weight 72.1 kg (158 lb 15.2 oz), SpO2 86.00%. Physical Exam  Nursing note and vitals reviewed. Constitutional: He appears well-developed and well-nourished. No distress.       Unkempt  HENT:  Head: Normocephalic and atraumatic.  Mouth/Throat: No oropharyngeal exudate.       Dry oropharynx/oropharyngeal mucosa  Neck: Normal range of motion. Neck supple. No JVD present. No tracheal deviation present. No thyromegaly present.  Cardiovascular: Normal rate, regular rhythm and intact distal pulses.  Exam reveals no gallop and no friction rub.   Murmur heard.      ESM over apex  Respiratory: Effort normal and breath sounds normal. No stridor. No respiratory distress. He has no wheezes. He has no rales. He exhibits no tenderness.  GI: Soft. Bowel sounds are normal. He exhibits no distension and no mass. There is no tenderness. There is no rebound and no guarding.  Musculoskeletal: Normal range of motion. He exhibits edema. He exhibits no tenderness.       1-2+ LE edema with chronic venous dermopathy  Lymphadenopathy:    He has no cervical adenopathy.  Neurological: He is alert.       The patient is disoriented to time and person. Most of his conversation is fixated at informing me that he had IV lines placed  Skin: Skin is warm and dry. No rash noted. No erythema.  Psychiatric: He has a normal mood and affect.    Assessment/Plan: 1. Hyponatremia: Probably precipitated by hydrochlorothiazide use and will reevaluate serum and urine electrolytes/osmolality to see if this is compatible with SIADH. He currently on 3% saline appropriately to help correct his hyponatremia. Unclear as to whether this will help his overall mentation. TSH level normal, cortisol levels normal and so far he hss not been hyperglycemic. Will check a serum  protein electrophoresis as well as free light chains to rule out plasma cell dyscrasia. Will monitor sodium vigilantly to avoid overcorrection/osmotic injury. Bupropion may be a culprit medication in reducing SIADH. For now, will hold lisinopril as this may interfere with appropriate correction of hyponatremia.  2. Anemia: Significant iron deficiency appreciated, recommend intravenous iron therapy-further workup per gastroenterology/hospital service. No overt losses noted. 3. Hypertension: Reevaluate antihypertensive therapy following discontinuation of lisinopril alternative agent such as hydralazine 3 times a day or twice a day. 4. Hypoglycemia: Resolved, continue to monitor closely and encourage oral intake. Josephmichael Lisenbee K. 08/05/2011, 1:14 PM

## 2011-08-05 NOTE — Evaluation (Signed)
Physical Therapy Evaluation Patient Details Name: Michael Yang MRN: [id number] DOB: 1924-01-10 Today's Date: 08/05/2011 Time: 1308-6578 PT Time Calculation (min): 16 min  PT Assessment / Plan / Recommendation Clinical Impression  78 y.o. male admitted to [facility] from SNF for hypoglycemia, hyponatremia, and anemia.  He presents today with decreased balance, decreased safety awareness and generally high fall risk even with RW.  He would benefit from acute PT to maximize his independence, functional mobility and safety so that he may return to SNF safely at discharge.      PT Assessment  Patient needs continued PT services    Follow Up Recommendations  Skilled nursing facility (vs ALF- back to [facility] at discharge) Sister seemed to think that [facility] was a SNF.  He is appropriate to return to ALF if that is where he was PTA.     Equipment Recommendations  Rolling walker with 5" wheels (recommend continued use of RW at discharge.)    Frequency Min 2X/week    Precautions / Restrictions Precautions Precautions: Fall (especially without assistive device.  )   Pertinent Vitals/Pain No reports of pain, VSS throughout treatment session.       Mobility  Bed Mobility Bed Mobility: Sit to Supine Sit to Supine: 4: Min assist Details for Bed Mobility Assistance: min assist to help bil legs back into high bed.    Transfers Transfers: Sit to Stand;Stand to Sit Sit to Stand: 4: Min assist;From chair/3-in-1;With upper extremity assist Stand to Sit: 4: Min assist;To bed;With armrests Details for Transfer Assistance: min assist for safety and balance during transitions.  The patient is very quick to move before therapist is ready.    Ambulation/Gait Ambulation/Gait Assistance: 4: Min assist Ambulation Distance (Feet): 450 Feet Assistive device: Rolling walker Ambulation/Gait Assistance Details: attempted gait without RW and the patient needed two person hand held assist to be safe, once RW  used the patient was able to be min assist.  He was unable to process cues to stay closer to RW and gait speed was Likely > 3.0 ft/sec.  He is generally too fast for safety.  When turning corners needed extra assistance to help maneuver RW.   Gait Pattern: Step-through pattern;Shuffle;Trunk flexed Gait velocity: > 3.0 ft/sec a little to fast for him to be safe.   General Gait Details: Without RW very Parkinsonian type shuffling gait with trunk anterior of his COG.  With RW much more fluid step through type gait pattern and certainly 10 fold increase in gait speed, safety and stability.       PT Goals Acute Rehab PT Goals PT Goal Formulation: With patient/family Time For Goal Achievement: 08/19/11 Potential to Achieve Goals: Good Pt will go Supine/Side to Sit: with modified independence PT Goal: Supine/Side to Sit - Progress: Goal set today Pt will go Sit to Supine/Side: with modified independence PT Goal: Sit to Supine/Side - Progress: Goal set today Pt will go Sit to Stand: with supervision PT Goal: Sit to Stand - Progress: Goal set today Pt will go Stand to Sit: with supervision PT Goal: Stand to Sit - Progress: Goal set today Pt will Transfer Bed to Chair/Chair to Bed: with supervision PT Transfer Goal: Bed to Chair/Chair to Bed - Progress: Goal set today Pt will Ambulate: >150 feet;with supervision;with rolling walker PT Goal: Ambulate - Progress: Goal set today  Visit Information  Last PT Received On: 08/05/11 Assistance Needed: +1    Subjective Data  Subjective: The patient denies any lightheadedness or SOB  with gait.     Prior Functioning  Home Living Available Help at Discharge: Skilled Nursing Facility (per sister [facility] is a SNF) Additional Comments: sister is not sure, but thinks he walks with a RW Communication Communication: HOH    Cognition  Overall Cognitive Status: History of cognitive impairments - at baseline Behavior During Session: [facility] for tasks  performed    Extremity/Trunk Assessment Right Upper Extremity Assessment RUE ROM/Strength/Tone: Within functional levels Left Upper Extremity Assessment LUE ROM/Strength/Tone: Within functional levels Right Lower Extremity Assessment RLE ROM/Strength/Tone: Deficits RLE ROM/Strength/Tone Deficits: grossly 3+/5 per functional assessment.   Left Lower Extremity Assessment LLE ROM/Strength/Tone: Deficits LLE ROM/Strength/Tone Deficits: grossly 3+/5 per functional assessment.     End of Session PT - End of Session Activity Tolerance: Patient tolerated treatment well Patient left: in bed;with call bell/phone within reach;with family/visitor present;Other (comment) (sister in room, also, sitter in room)   Lurena Joiner B. Deno Sida, PT, DPT [phone] 08/05/2011, 3:25 PM

## 2011-08-05 NOTE — Progress Notes (Signed)
[facility] Progress Note  SUBJECTIVE: feels okay today. No abdominal pain or nausea. Per RN, just had a normal BM.   OBJECTIVE:  Vital signs in last 24 hours: Temp:  [98.5 F (36.9 C)-98.9 F (37.2 C)] 98.6 F (37 C) (04/30 0400) Pulse Rate:  [59-94] 59  (04/30 0700) Resp:  [13-23] 17  (04/30 0700) BP: (90-142)/(26-74) 118/74 mmHg (04/30 0700) SpO2:  [86 %-99 %] 86 % (04/30 0700) Weight:  [158 lb 15.2 oz (72.1 kg)] 158 lb 15.2 oz (72.1 kg) (04/30 0000) Last BM Date: 08/03/11 General:    Pleasant black male in NAD Heart:  Regular rate and rhythm Abdomen:  Soft, nontender and mildly distended with normal bowel sounds. .Neurologic:  Alert, answers questions appropriately.  Psych:  Cooperative.  Lab Results:  Basename 08/05/11 0416 08/04/11 1808 08/04/11 0630  WBC 11.0* 12.4* 11.5*  HGB 7.2* 7.6* 7.7*  HCT 22.7* 23.5* 23.5*  PLT 336 310 370   BMET  Basename 08/05/11 0800 08/05/11 0416 08/05/11 0013  NA 118* 116* 114*  K 3.8 3.9 4.0  CL 88* 85* 85*  CO2 24 23 23   GLUCOSE 121* 97 107*  BUN 9 10 11   CREATININE 0.83 0.83 0.85  CALCIUM 8.3* 8.4 8.2*   PT/INR  Basename 08/04/11 0630  LABPROT 14.0  INR 1.06    Studies/Results: Dg Chest 2 View  08/04/2011  *RADIOLOGY REPORT*  Clinical Data: Prostate cancer, cough, congestion.  Smoker.  CHEST - 2 VIEW  Comparison: 08/04/2011  Findings: There are small bilateral pleural effusions.  Bibasilar atelectasis or scarring noted.  Heart is normal size.  Mild interstitial prominence has improved.  IMPRESSION: Small bilateral pleural effusions.  Bibasilar opacities.  I favor this represents atelectasis or scarring.  Original Report Authenticated By: Cyndie Chime, M.D.    ASSESSMENT / PLAN:  54. 78 year old male with iron deficiency anemia, heme positive stools. He needs endoscopic workup once hyponatremia is corrected.  2. hyponatremia, primary team feels this may be secondary to SIADH. He is on fluid restriction,  hypertonic saline. Sodium up to 118 (from 114 yesterday). Nephrology to evaluate.  3. Dementia. Not sure who makes medical decisions for him. We will need to talk with POA (if has one) about GI procedures. Patient does have a sister Merdis Delay) who may give consent for procedures if no POA    LOS: 2 days   Willette Cluster  08/05/2011, 9:46 AM

## 2011-08-05 NOTE — Evaluation (Signed)
Clinical/Bedside Swallow Evaluation Patient Details  Name: Michael Yang MRN: [id number] Date of Birth: 06-12-1923  Today's Date: 08/05/2011 Time: 4098-1191 SLP Time Calculation (min): 32 min  Past Medical History:  Past Medical History  Diagnosis Date  . Hypertension   . Prostate neoplasm   . Angiospasm   . Diabetes mellitus   . Schizoaffective disorder   . Dementia   . Cataracts, bilateral   . Blindness   . Rhabdomyolysis   . Gastritis   . UTI (lower urinary tract infection)    Past Surgical History: History reviewed. No pertinent past surgical history. HPI:      Assessment / Plan / Recommendation Clinical Impression  Pt observed with "snack" of graham crackers x4, applesauce x4 ounces, 4 ounces nectar apple juice and 2 ounces water.  Very rapid rate of intake observed but no clinical s/s of aspiration.  Sister present and reports pt without dysphagia prior to admission and eats at rapid rate since his [employer] service.  Note pt CXR with bilateral small effusions-favor ATX or scarring.  Pt surprsingly able to masticate graham crackers without delay - missing and poor dentition present.  Recommend continue soft diet with intermittent supervision for due to impulsivity of pt.  Pt denies h/o CVA.Marland Kitchenacknowledges "heat stroke", note lingual deviation (? glossopharyngeal nerve) to right upon protrusion and ? decr right facial movement (facial nerve).  Pt's sister also denies pt having a stroke in the past.  Sister reports pt continues to smoke - cough observed by SLP WITHOUT po.      Aspiration Risk  Mild    Diet Recommendation Dysphagia 3 (Mechanical Soft);Thin liquid Intermittent supervision.    Other  Recommendations   Reorder please if have more concerns regarding possible dysphagia.  Dental visit for multiple caries.     Follow Up Recommendations  None    Frequency and Duration   None     Pertinent Vitals/Pain No c/o pain, stable    SLP Swallow Goals N/A  Swallow  Study Prior Functional Status   Pt at SNF, fed self per his report and sister statement.  On NCS diet.     General  Pt adm with anemia/hyponatremia, hemoccult + stool.    Dg Chest 2 View  08/04/2011  *RADIOLOGY REPORT*  Clinical Data: Prostate cancer, cough, congestion.  Smoker.  CHEST - 2 VIEW  Comparison: 08/04/2011  Findings: There are small bilateral pleural effusions.  Bibasilar atelectasis or scarring noted.  Heart is normal size.  Mild interstitial prominence has improved.  IMPRESSION: Small bilateral pleural effusions.  Bibasilar opacities.  I favor this represents atelectasis or scarring.  Original Report Authenticated By: Cyndie Chime, M.D.   Dg Chest Port 1 View  08/05/2011  *RADIOLOGY REPORT*  Clinical Data: Cough, hypertension  PORTABLE CHEST - 1 VIEW  Comparison: 08/04/2011  Findings: Cardiomediastinal silhouette is stable.  There is poor inspiration.  No convincing pulmonary edema.  Persistent small bilateral pleural effusion with streaky bilateral basilar atelectasis or infiltrate.  IMPRESSION: Poor inspiration.  Small bilateral pleural effusion with streaky bilateral basilar atelectasis or infiltrate.  No pulmonary edema.  Original Report Authenticated By: Natasha Mead, M.D.   Dg Chest Port 1 View  08/04/2011  *RADIOLOGY REPORT*  Clinical Data: Hypertension  PORTABLE CHEST - 1 VIEW  Comparison: None.  Findings: Hyperinflation with coarse interstitial markings. Cardiomegaly.  Aortic arch atherosclerosis.  Mild opacity along the left heart border.  No pleural effusion or pneumothorax.  No acute osseous abnormality.  IMPRESSION: Interstitial prominence  and mild opacity along the left heart border; atelectasis versus infiltrate. Recommend PA and lateral radiographs when the patient can tolerate  for better characterization.  Original Report Authenticated By: Waneta Martins, M.D.      Oral/Motor/Sensory Function   Question Right facial decreased, lingual deviation to right, pt noted  to drool - he states this occurs prior to admission.    Ice Chips   DNT  Thin Liquid   WFL   Nectar Thick   WFL-Labial spillage x1  Honey Thick   DNT  Puree   [facility]  Solid   WFLDonavan Burnet, MS [facility] SLP [phone]

## 2011-08-05 NOTE — Progress Notes (Signed)
I have reviewed the above note,and agree with plan of treatment. Will order CEA level. Will hopefully be able to prep for colonoscopy by the end of the week. Hgb 7.2, may benefit from a transfusion.before prepping for colonoscopy

## 2011-08-05 NOTE — Progress Notes (Signed)
Patient refuses to wear SCD's repeatedly attempting to get out of bed.  Would recommend Recruitment consultant.

## 2011-08-05 NOTE — Progress Notes (Signed)
CRITICAL VALUE ALERT  Critical value received:  NA  116  Date of notification:  08/05/11 Time of notification:  05:44  Critical value read back: Yes  Nurse who received alert: Stephanie Coup, RN  MD notified (1st page):  Schorr  Time of first page:  5:43  MD notified (2nd page):  Time of second page:  Responding MD:  Schorr  Time MD responded:  5:45

## 2011-08-05 NOTE — Progress Notes (Signed)
Clinical Social Work Department CLINICAL SOCIAL WORK PLACEMENT NOTE 08/05/2011  Patient:  [facility]  Account Number:  [id number] Admit date:  08/03/2011  Clinical Social Worker:  Jodelle Red  Date/time:  08/05/2011 10:00 AM  Clinical Social Work is seeking post-discharge placement for this patient at the following level of care:   ASSISTED LIVING/REST HOME   (*CSW will update this form in Epic as items are completed)     Patient/family provided with [facility] Department of Clinical Social Work's list of facilities offering this level of care within the geographic area requested by the patient (or if unable, by the patient's family).    Patient/family informed of their freedom to choose among providers that offer the needed level of care, that participate in Medicare, Medicaid or managed care program needed by the patient, have an available bed and are willing to accept the patient.    Patient/family informed of [facility]' ownership interest in [facility], as well as of the fact that they are under no obligation to receive care at this facility.  PASARR submitted to EDS on  PASARR number received from EDS on   FL2 transmitted to all facilities in geographic area requested by pt/family on   FL2 transmitted to all facilities within larger geographic area on   Patient informed that his/her managed care company has contracts with or will negotiate with  certain facilities, including the following:     Patient/family informed of bed offers received:   Patient chooses bed at [facility]. [facility] Physician recommends and patient chooses bed at    Patient to be transferred to  on   Patient to be transferred to facility by   The following physician request were entered in Epic:   Additional Comments: Pt has existing ALF, [facility]. Plan is to return on d/c  Vennie Homans, [place] 08/05/2011 10:02 AM

## 2011-08-06 DIAGNOSIS — R112 Nausea with vomiting, unspecified: Secondary | ICD-10-CM

## 2011-08-06 DIAGNOSIS — G309 Alzheimer's disease, unspecified: Secondary | ICD-10-CM

## 2011-08-06 DIAGNOSIS — F0280 Dementia in other diseases classified elsewhere without behavioral disturbance: Secondary | ICD-10-CM

## 2011-08-06 DIAGNOSIS — R05 Cough: Secondary | ICD-10-CM

## 2011-08-06 DIAGNOSIS — E871 Hypo-osmolality and hyponatremia: Secondary | ICD-10-CM

## 2011-08-06 LAB — TYPE AND SCREEN
Unit division: 0
Unit division: 0

## 2011-08-06 LAB — BASIC METABOLIC PANEL
BUN: 13 mg/dL (ref 6–23)
BUN: 15 mg/dL (ref 6–23)
CO2: 22 mEq/L (ref 19–32)
Calcium: 8.1 mg/dL — ABNORMAL LOW (ref 8.4–10.5)
Calcium: 8.2 mg/dL — ABNORMAL LOW (ref 8.4–10.5)
Calcium: 8.5 mg/dL (ref 8.4–10.5)
Calcium: 8.6 mg/dL (ref 8.4–10.5)
Calcium: 8.7 mg/dL (ref 8.4–10.5)
Chloride: 92 mEq/L — ABNORMAL LOW (ref 96–112)
Creatinine, Ser: 0.82 mg/dL (ref 0.50–1.35)
GFR calc Af Amer: >90 mL/min (ref >90)
GFR calc Af Amer: >90 mL/min (ref >90)
GFR calc Af Amer: 76 mL/min — ABNORMAL LOW (ref >90)
GFR calc non Af Amer: 65 mL/min — ABNORMAL LOW (ref >90)
GFR calc non Af Amer: 73 mL/min — ABNORMAL LOW (ref >90)
GFR calc non Af Amer: 74 mL/min — ABNORMAL LOW (ref >90)
GFR calc non Af Amer: 78 mL/min — ABNORMAL LOW (ref >90)
GFR calc non Af Amer: 78 mL/min — ABNORMAL LOW (ref >90)
Glucose, Bld: 93 mg/dL (ref 70–99)
Glucose, Bld: 78 mg/dL (ref 70–99)
Potassium: 3.9 mEq/L (ref 3.5–5.1)
Potassium: 3.9 mEq/L (ref 3.5–5.1)
Potassium: 4.2 mEq/L (ref 3.5–5.1)
Sodium: 121 mEq/L — ABNORMAL LOW (ref 135–145)
Sodium: 121 mEq/L — ABNORMAL LOW (ref 135–145)
Sodium: 123 mEq/L — ABNORMAL LOW (ref 135–145)
Sodium: 125 mEq/L — ABNORMAL LOW (ref 135–145)
Sodium: 125 mEq/L — ABNORMAL LOW (ref 135–145)
Sodium: 125 mEq/L — ABNORMAL LOW (ref 135–145)

## 2011-08-06 LAB — GLUCOSE, CAPILLARY
Glucose-Capillary: 106 mg/dL — ABNORMAL HIGH (ref 70–99)
Glucose-Capillary: 107 mg/dL — ABNORMAL HIGH (ref 70–99)
Glucose-Capillary: 117 mg/dL — ABNORMAL HIGH (ref 70–99)
Glucose-Capillary: 84 mg/dL (ref 70–99)

## 2011-08-06 LAB — OSMOLALITY: Osmolality: 251 mOsm/kg — ABNORMAL LOW (ref 275–300)

## 2011-08-06 LAB — OSMOLALITY, URINE: Osmolality, Ur: 410 mOsm/kg (ref 390–1090)

## 2011-08-06 LAB — CBC
MCV: 75.3 fL — ABNORMAL LOW (ref 78.0–100.0)
Platelets: 372 10*3/uL (ref 150–400)
RBC: 3.52 MIL/uL — ABNORMAL LOW (ref 4.22–5.81)
WBC: 13.1 10*3/uL — ABNORMAL HIGH (ref 4.0–10.5)

## 2011-08-06 LAB — KAPPA/LAMBDA LIGHT CHAINS: Kappa free light chain: 3.11 mg/dL — ABNORMAL HIGH (ref 0.33–1.94)

## 2011-08-06 MED ORDER — SODIUM CHLORIDE 0.9 % IV SOLN
INTRAVENOUS | Status: DC
  Administered 2011-08-06: 1000 mL via INTRAVENOUS

## 2011-08-06 MED ORDER — SODIUM CHLORIDE 3 % IV SOLN
INTRAVENOUS | Status: DC
  Administered 2011-08-06: 30 mL/h via INTRAVENOUS
  Administered 2011-08-06: 21:00:00 via INTRAVENOUS
  Filled 2011-08-06 (×3): qty 500

## 2011-08-06 MED ORDER — BUPROPION HCL ER (SR) 100 MG PO TB12
100.0000 mg | ORAL_TABLET | Freq: Every morning | ORAL | Status: DC
  Administered 2011-08-07 – 2011-08-11 (×5): 100 mg via ORAL
  Filled 2011-08-06 (×5): qty 1

## 2011-08-06 NOTE — Progress Notes (Signed)
Subjective: Patient reports he feels better today.  No acute issues overnight.  Objective: Filed Vitals:   08/05/11 2236 08/05/11 2317 08/06/11 0000 08/06/11 0330  BP: 133/62 127/61  132/59  Pulse:  96  87  Temp:   98.3 F (36.8 C) 98.4 F (36.9 C)  TempSrc:   Oral Oral  Resp:  21  15  Height:      Weight:   71.6 kg (157 lb 13.6 oz)   SpO2:  98%  95%   Weight change: -0.5 kg (-1 lb 1.6 oz)  Intake/Output Summary (Last 24 hours) at 08/06/11 1108 Last data filed at 08/06/11 0900  Gross per 24 hour  Intake   1990 ml  Output   2150 ml  Net   -160 ml    General: Alert, awake, oriented x2 (person and place), in no acute distress.  HEENT: No bruits, no goiter.  Heart: Regular rate and rhythm, without murmurs, rubs, gallops.  Lungs: CTA BL bilateral air movement.  Abdomen: Soft, nontender, nondistended, positive bowel sounds.  Neuro: Grossly intact, nonfocal.   Lab Results:  Basename 08/06/11 0813 08/06/11 0340  NA 123* 121*  K 3.9 4.0  CL 92* 90*  CO2 24 22  GLUCOSE 93 101*  BUN 11 12  CREATININE 0.81 0.78  CALCIUM 8.2* 8.5  MG -- --  PHOS -- --   No results found for this basename: AST:2,ALT:2,ALKPHOS:2,BILITOT:2,PROT:2,ALBUMIN:2 in the last 72 hours No results found for this basename: LIPASE:2,AMYLASE:2 in the last 72 hours  Basename 08/06/11 0340 08/05/11 0416  WBC 13.1* 11.0*  NEUTROABS -- --  HGB 8.5* 7.2*  HCT 26.5* 22.7*  MCV 75.3* 73.9*  PLT 372 336   No results found for this basename: CKTOTAL:3,CKMB:3,CKMBINDEX:3,TROPONINI:3 in the last 72 hours No components found with this basename: POCBNP:3 No results found for this basename: DDIMER:2 in the last 72 hours  Basename 08/04/11 0630  HGBA1C 4.4   No results found for this basename: CHOL:2,HDL:2,LDLCALC:2,TRIG:2,CHOLHDL:2,LDLDIRECT:2 in the last 72 hours  Basename 08/04/11 0630  TSH 0.797  T4TOTAL --  T3FREE --  THYROIDAB --    Basename 08/03/11 2042  VITAMINB12 388  FOLATE >20.0    FERRITIN 7*  TIBC Not calculated due to Iron <10.  IRON <10*  RETICCTPCT 1.8    Micro Results: Recent Results (from the past 240 hour(s))  MRSA PCR SCREENING     Status: Abnormal   Collection Time   08/04/11  2:17 AM      Component Value Range Status Comment   MRSA by PCR POSITIVE (*) NEGATIVE  Final     Studies/Results: Dg Chest Port 1 View  08/05/2011  *RADIOLOGY REPORT*  Clinical Data: Cough, hypertension  PORTABLE CHEST - 1 VIEW  Comparison: 08/04/2011  Findings: Cardiomediastinal silhouette is stable.  There is poor inspiration.  No convincing pulmonary edema.  Persistent small bilateral pleural effusion with streaky bilateral basilar atelectasis or infiltrate.  IMPRESSION: Poor inspiration.  Small bilateral pleural effusion with streaky bilateral basilar atelectasis or infiltrate.  No pulmonary edema.  Original Report Authenticated By: Natasha Mead, M.D.    Medications: I have reviewed the patient's current medications.   Patient Active Hospital Problem List: GI bleed (08/03/2011) GI on board and we are awaiting for patient's sodium levels to improve prior to further evaluation.  Anemia (08/03/2011) More than likely related to # 1.  Hyponatremia (08/03/2011) This am I switched patient from hypertonic solution to normal saline.  Considered hyponatremia secondary to poor oral solute intake.  But agree that this at this juncture given patient's recent history and elevated Urine sodium and urine osmolality this most likely suggests SIADH.  Thus I have discontinued the normal saline and will continue the 3% saline until goal of  > 128 as outlined by nephrology.    Also fluid restrict patient.  Will continue to follow BMP levels.    Also will decrease patient's wellbutrin dose today.   Diabetes mellitus, type 2 (08/03/2011) Well controlled at this point.  Will plan on continuing current regimen.  HTN (hypertension) (08/03/2011) Stable will continue to monitor.  Agree with holding  HCTZ given patient's hyponatremia  Dementia (08/03/2011) May have been exacerbated by hyponatremia.  Stable currently and will reassess.     LOS: 3 days   Penny Pia M.D.  [facility] 08/06/2011, 11:08 AM

## 2011-08-06 NOTE — Evaluation (Signed)
Occupational Therapy Evaluation Patient Details Name: Michael Yang MRN: [id number] DOB: [birth year] Today's Date: 08/06/2011 Time: 4098-1191 OT Time Calculation (min): 41 min  OT Assessment / Plan / Recommendation Clinical Impression  This 78 year old male was admitted with a GIB.  He is a resident of [address] [facility] and staff report that he does ADL activities with prompting and assist for the shower.  He now needs overall min guard to min A and moves often--he is unaware of multiple lines.  He is appropriate for skilled OT in acute    OT Assessment  Patient needs continued OT Services    Follow Up Recommendations  Other (comment);Home health OT (if [facility] can provide needed assist and S; otherwise SNF)    Equipment Recommendations  Rolling walker with 5" wheels    Frequency Min 2X/week    Precautions / Restrictions Precautions Precautions: Fall Restrictions Weight Bearing Restrictions: No   Pertinent Vitals/Pain     ADL  Eating/Feeding: Simulated;Independent Where Assessed - Eating/Feeding: Chair Grooming: Performed;Minimal assistance;Teeth care (min guard for balance and assist to clean up spills) Where Assessed - Grooming: Standing at sink Upper Body Bathing: Simulated;Supervision/safety (min cues for all adls) Where Assessed - Upper Body Bathing: Sitting, chair;Unsupported Lower Body Bathing: Simulated;Minimal assistance;Other (comment) (min guard) Where Assessed - Lower Body Bathing: Sit to stand from chair Upper Body Dressing: Performed;Moderate assistance;Other (comment) (multiple lines) Where Assessed - Upper Body Dressing: Sitting, chair;Supported Lower Body Dressing: Simulated;Performed;Minimal assistance (min guard for balance; performed socks with set up; effortfu) Where Assessed - Lower Body Dressing: Sit to stand from chair Toilet Transfer: Simulated;Minimal assistance (mod cues for safety to recliner ) Toilet Transfer Method: Ambulating (3 feet) Toileting -  Clothing Manipulation: Simulated;Minimal assistance (min guard) Where Assessed - Toileting Clothing Manipulation: Standing Toileting - Hygiene: Simulated;Minimal assistance (min guard) Where Assessed - Toileting Hygiene: Standing;Other (comment) (stood to use urinal; had difficulty with placement:  min A; urinated on floor a little Tub/Shower Transfer: Not assessed Equipment Used: Rolling walker Ambulation Related to ADLs: min A, mod cues for safety walking to sink ADL Comments: Pt stands without notice; sitter present. During OT eval, noted that IV was disconnected (unscrewed). RN came in and fixed it.      OT Goals Acute Rehab OT Goals OT Goal Formulation: Patient unable to participate in goal setting Time For Goal Achievement: 08/20/11 Potential to Achieve Goals: Fair ADL Goals Pt Will Transfer to Toilet: with supervision;Ambulation;Regular height toilet;Other (comment) (min vcs for safety) ADL Goal: Toilet Transfer - Progress: Goal set today Miscellaneous OT Goals Miscellaneous OT Goal #1: Pt will perform LB ADLs with supervision and set up with min vcs. OT Goal: Miscellaneous Goal #1 - Progress: Goal set today Miscellaneous OT Goal #2: Pt will identify call light for needed assistance OT Goal: Miscellaneous Goal #2 - Progress: Goal set today  Visit Information  Last OT Received On: 08/06/11 Assistance Needed: +1    Subjective Data  Subjective: "yeah, I'd like to brush my teeth" Patient Stated Goal: none stated; agreeable to OT   Prior Functioning  Home Living Lives With: Other (Comment) ([facility]) Available Help at Discharge: Other (Comment) (staff) Prior Function Level of Independence: Needs assistance (mostly cues, per staff at [facility] into shower also) Communication Communication: HOH    Cognition  Overall Cognitive Status: History of cognitive impairments - at baseline Behavior During Session: [facility] for tasks performed    Extremity/Trunk Assessment Right  Upper Extremity Assessment RUE ROM/Strength/Tone: Within functional  levels Left Upper Extremity Assessment LUE ROM/Strength/Tone: Within functional levels   Mobility Transfers Sit to Stand: 4: Min guard Stand to Sit: 4: Min assist;With upper extremity assist;To chair/3-in-1   Exercise    Balance Balance Balance Assessed: Yes (min guard for balance secondary to unpredictability; no LOB)  End of Session OT - End of Session Activity Tolerance: Patient tolerated treatment well Patient left: in chair;with call bell/phone within reach;Other (comment) (sitter)   Asusena Sigley 08/06/2011, 2:34 PM Marica Otter, OTR/L [phone] 08/06/2011

## 2011-08-06 NOTE — Progress Notes (Signed)
Patient ID: Michael Yang, male   DOB: 03/05/1924, 78 y.o.   MRN: [id number]   [facility] Progress Note    Subjective:   Reports to be feeling somewhat better, informs with her appetite is good. Switched from 3% saline to normal saline earlier this morning.    Objective:   BP 132/59  Pulse 87  Temp(Src) 98.4 F (36.9 C) (Oral)  Resp 15  Ht 5\' 7"  (1.702 m)  Wt 71.6 kg (157 lb 13.6 oz)  BMI 24.72 kg/m2  SpO2 95%  Intake/Output Summary (Last 24 hours) at 08/06/11 1052 Last data filed at 08/06/11 0900  Gross per 24 hour  Intake   2020 ml  Output   2150 ml  Net   -130 ml   Weight change: -0.5 kg (-1 lb 1.6 oz)  Physical Exam: Gen: Comfortably sitting up in his recliner CVS: Pulse regular in rate and rhythm, S1 and S2 with an ejection systolic murmur Resp: Diminished breath sounds bibasally.-no rales/rhonchi Abd: Soft, obese, nontender, bowel sounds are normal Ext: Trace to 1+ edema bipedally over ankles  Imaging: Dg Chest Port 1 View  08/05/2011  *RADIOLOGY REPORT*  Clinical Data: Cough, hypertension  PORTABLE CHEST - 1 VIEW  Comparison: 08/04/2011  Findings: Cardiomediastinal silhouette is stable.  There is poor inspiration.  No convincing pulmonary edema.  Persistent small bilateral pleural effusion with streaky bilateral basilar atelectasis or infiltrate.  IMPRESSION: Poor inspiration.  Small bilateral pleural effusion with streaky bilateral basilar atelectasis or infiltrate.  No pulmonary edema.  Original Report Authenticated By: Natasha Mead, M.D.    Labs: BMET  Lab 08/06/11 0813 08/06/11 0340 08/06/11 0005 08/05/11 1844 08/05/11 1528 08/05/11 0800 08/05/11 0416  NA 123* 121* 121* 121* 121* 118* 116*  K 3.9 4.0 4.1 4.1 3.9 3.8 3.9  CL 92* 90* 89* 89* 88* 88* 85*  CO2 24 22 23 23 23 24 23   GLUCOSE 93 101* 106* 107* 104* 121* 97  BUN 11 12 13 13 11 9 10   CREATININE 0.81 0.78 0.82 0.87 0.91 0.83 0.83  ALB -- -- -- -- -- -- --  CALCIUM 8.2* 8.5 8.2* 8.4  8.2* 8.3* 8.4  PHOS -- -- -- -- -- -- --   CBC  Lab 08/06/11 0340 08/05/11 0416 08/04/11 1808 08/04/11 0630  WBC 13.1* 11.0* 12.4* 11.5*  NEUTROABS -- -- -- --  HGB 8.5* 7.2* 7.6* 7.7*  HCT 26.5* 22.7* 23.5* 23.5*  MCV 75.3* 73.9* 73.7* 73.7*  PLT 372 336 310 370    Medications:      . antiseptic oral rinse  15 mL Mouth Rinse BID  . azithromycin  500 mg Intravenous Q24H  . buPROPion  150 mg Oral q morning - 10a  . cefTRIAXone (ROCEPHIN)  IV  1 g Intravenous Q24H  . Chlorhexidine Gluconate Cloth  6 each Topical Daily  . dextrose  1 ampule Intravenous Once  . finasteride  5 mg Oral Daily  . fluticasone  1 spray Each Nare q morning - 10a  . hydrALAZINE  25 mg Oral BID  . hydrocerin  1 application Topical BID  . insulin aspart  0-9 Units Subcutaneous TID WC  . mulitivitamin with minerals  1 tablet Oral q morning - 10a  . mupirocin ointment  1 application Nasal BID  . pantoprazole (PROTONIX) IV  40 mg Intravenous Q12H  . sodium chloride  3 mL Intravenous Q12H  . terazosin  1 mg Oral QHS  . DISCONTD: lisinopril  20 mg  Oral Daily     Assessment/ Plan:   1. Hyponatremia: Probably precipitated by hydrochlorothiazide use and will reevaluate serum and urine electrolytes/osmolality to see if this is compatible with SIADH. He is currently off 3% saline and as we switched over to normal saline earlier this morning-suspected this may compound or worsening hyponatremia due desalination seen in SIADH-await labs from this afternoon to see if we need to switch back to 3% saline. Would continue this until sodium >128 and then leave him on fluid restriction and off hydrochlorothiazide. Bupropion may be a culprit medication in reducing SIADH. For now, will hold lisinopril as this may interfere with appropriate correction of hyponatremia.  2. Anemia: Significant iron deficiency appreciated, recommend intravenous iron therapy-further workup per gastroenterology/hospital service. No overt losses noted.  Plans noted for endoscopy in the near future given permissive sodium levels 3. Hypertension: Reevaluate antihypertensive therapy following discontinuation of lisinopril alternative agent such as hydralazine 3 times a day or twice a day.  4. Hypoglycemia: Resolved, continue to monitor closely and encourage oral intake.   Zetta Bills, MD 08/06/2011, 10:52 AM

## 2011-08-06 NOTE — Progress Notes (Signed)
Follow up heme positive anemia, Hgb 7.2 -  EGD/colon on hold pending normalization of electrolyte abnormalities. Na+ up to 123 today. Will continue to follow.will obtain permits for EGD/colon from a POA

## 2011-08-07 DIAGNOSIS — G309 Alzheimer's disease, unspecified: Secondary | ICD-10-CM

## 2011-08-07 DIAGNOSIS — F0280 Dementia in other diseases classified elsewhere without behavioral disturbance: Secondary | ICD-10-CM

## 2011-08-07 DIAGNOSIS — E871 Hypo-osmolality and hyponatremia: Secondary | ICD-10-CM

## 2011-08-07 DIAGNOSIS — R112 Nausea with vomiting, unspecified: Secondary | ICD-10-CM

## 2011-08-07 DIAGNOSIS — R05 Cough: Secondary | ICD-10-CM

## 2011-08-07 LAB — BASIC METABOLIC PANEL
BUN: 13 mg/dL (ref 6–23)
BUN: 12 mg/dL (ref 6–23)
CO2: 22 mEq/L (ref 19–32)
Calcium: 8.5 mg/dL (ref 8.4–10.5)
Calcium: 8.5 mg/dL (ref 8.4–10.5)
Chloride: 97 mEq/L (ref 96–112)
Chloride: 97 mEq/L (ref 96–112)
Creatinine, Ser: 0.87 mg/dL (ref 0.50–1.35)
Creatinine, Ser: 0.88 mg/dL (ref 0.50–1.35)
Creatinine, Ser: 0.89 mg/dL (ref 0.50–1.35)
Creatinine, Ser: 0.9 mg/dL (ref 0.50–1.35)
GFR calc Af Amer: 87 mL/min — ABNORMAL LOW (ref >90)
GFR calc Af Amer: 87 mL/min — ABNORMAL LOW (ref >90)
GFR calc Af Amer: 88 mL/min — ABNORMAL LOW (ref >90)
GFR calc non Af Amer: 75 mL/min — ABNORMAL LOW (ref >90)
GFR calc non Af Amer: 75 mL/min — ABNORMAL LOW (ref >90)
GFR calc non Af Amer: 76 mL/min — ABNORMAL LOW (ref >90)
Glucose, Bld: 89 mg/dL (ref 70–99)
Glucose, Bld: 96 mg/dL (ref 70–99)
Potassium: 4 mEq/L (ref 3.5–5.1)
Potassium: 4.1 mEq/L (ref 3.5–5.1)
Sodium: 127 mEq/L — ABNORMAL LOW (ref 135–145)

## 2011-08-07 LAB — GLUCOSE, CAPILLARY
Glucose-Capillary: 92 mg/dL (ref 70–99)
Glucose-Capillary: 89 mg/dL (ref 70–99)

## 2011-08-07 LAB — PROTEIN ELECTROPHORESIS, SERUM
Alpha-1-Globulin: 5.8 % — ABNORMAL HIGH (ref 2.9–4.9)
Alpha-2-Globulin: 12.7 % — ABNORMAL HIGH (ref 7.1–11.8)
Beta 2: 5.6 % (ref 3.2–6.5)
Gamma Globulin: 16 % (ref 11.1–18.8)

## 2011-08-07 MED ORDER — FUROSEMIDE 20 MG PO TABS
20.0000 mg | ORAL_TABLET | Freq: Every day | ORAL | Status: DC
  Administered 2011-08-07 – 2011-08-11 (×5): 20 mg via ORAL
  Filled 2011-08-07 (×5): qty 1

## 2011-08-07 MED ORDER — PEG-KCL-NACL-NASULF-NA ASC-C 100 G PO SOLR
1.0000 | Freq: Once | ORAL | Status: AC
  Administered 2011-08-07: 100 g via ORAL
  Filled 2011-08-07: qty 1

## 2011-08-07 MED ORDER — PANTOPRAZOLE SODIUM 40 MG PO TBEC
40.0000 mg | DELAYED_RELEASE_TABLET | Freq: Every day | ORAL | Status: DC
  Administered 2011-08-08 – 2011-08-10 (×3): 40 mg via ORAL
  Filled 2011-08-07 (×4): qty 1

## 2011-08-07 NOTE — Progress Notes (Signed)
[facility] Progress Note  SUBJECTIVE: feels okay this am  OBJECTIVE:  Vital signs in last 24 hours: Temp:  [98.1 F (36.7 C)-98.9 F (37.2 C)] 98.1 F (36.7 C) (05/02 0800) Pulse Rate:  [50-97] 93  (05/02 0800) Resp:  [16-27] 17  (05/02 0800) BP: (119-152)/(49-84) 152/63 mmHg (05/02 0800) SpO2:  [64 %-100 %] 97 % (05/02 0800) Weight:  [160 lb 11.5 oz (72.9 kg)] 160 lb 11.5 oz (72.9 kg) (05/02 0004) Last BM Date: 08/05/11 General:    black male sitting in chair in NAD Heart:  Regular rate and rhythm Lungs: Respirations even and unlabored, decreased breath sounds bilaterally. No wheezes.  Abdomen:  Soft, nontender and nondistended. Normal bowel sounds. Extremities:  Without edema. Neurologic:  Alert, confused. Psych:  Cooperative.  Lab Results:  Basename 08/06/11 0340 08/05/11 0416 08/04/11 1808  WBC 13.1* 11.0* 12.4*  HGB 8.5* 7.2* 7.6*  HCT 26.5* 22.7* 23.5*  PLT 372 336 310   BMET  Basename 08/07/11 0800 08/07/11 0336 08/07/11  NA 127* 127* 127*  K 4.0 3.9 4.1  CL 97 97 97  CO2 22 22 21   GLUCOSE 96 89 94  BUN 13 14 15   CREATININE 0.87 0.88 0.89  CALCIUM 8.3* 8.5 8.3*    ASSESSMENT / PLAN:  38. 78 year old male with iron deficiency anemia, heme positive stools. He needs endoscopic evaluation. Sodium up to 127 today, will proceed with EGD and colonoscopy in am. I spoke to next of kin who is his sister Merdis Delay. The risks, benefits, and alternatives to EGD / Colonoscopy with possible biopsy and possible polypectomy were discussed with the patient's sister, Eino Farber, and she gives consent to proceed.   2. hyponatremia, primary team feels this is secondary to SIADH vrs poor intake. He has been changed from hypertonic fluids to normal saline now. On fluid restriction  3. Dementia.    LOS: 4 days   Willette Cluster  08/07/2011, 9:41 AM

## 2011-08-07 NOTE — Progress Notes (Signed)
Patient ID: Michael Yang, male   DOB: [birth year], 78 y.o.   MRN: [id number]   [facility] Progress Note    Subjective:   Denies any emerging complaints and states that he feels tired after ambulating the hallways   Objective:   BP 152/63  Pulse 93  Temp(Src) 98.1 F (36.7 C) (Oral)  Resp 17  Ht 5\' 7"  (1.702 m)  Wt 72.9 kg (160 lb 11.5 oz)  BMI 25.17 kg/m2  SpO2 97%  Intake/Output Summary (Last 24 hours) at 08/07/11 1018 Last data filed at 08/07/11 4098  Gross per 24 hour  Intake   2213 ml  Output   1375 ml  Net    838 ml   Weight change: 1.3 kg (2 lb 13.9 oz)  Physical Exam: JXB:JYNWGNFAOZH- up in recliner YQM:VHQIO RRR, normal S1 and S2  Resp:Coarse BS with decreased Basal BS NGE:XBMW, obese, Nt, BS normal Ext:1-2+ LE edema around ankles/lower leg  Imaging: No results found.  Labs: BMET  Lab 08/07/11 0800 08/07/11 0336 08/07/11 08/06/11 1950 08/06/11 1605 08/06/11 1440 08/06/11 1230  NA 127* 127* 127* 125* 125* 125* 123*  K 4.0 3.9 4.1 4.2 4.1 3.9 4.1  CL 97 97 97 94* 95* 92* 91*  CO2 22 22 21 22 20 22 23   GLUCOSE 96 89 94 101* 100* 78 119*  BUN 13 14 15 15 14 13 12   CREATININE 0.87 0.88 0.89 1.01 0.95 0.93 0.82  ALB -- -- -- -- -- -- --  CALCIUM 8.3* 8.5 8.3* 8.6 8.1* 8.7 8.7  PHOS -- -- -- -- -- -- --   CBC  Lab 08/06/11 0340 08/05/11 0416 08/04/11 1808 08/04/11 0630  WBC 13.1* 11.0* 12.4* 11.5*  NEUTROABS -- -- -- --  HGB 8.5* 7.2* 7.6* 7.7*  HCT 26.5* 22.7* 23.5* 23.5*  MCV 75.3* 73.9* 73.7* 73.7*  PLT 372 336 310 370    Medications:      . antiseptic oral rinse  15 mL Mouth Rinse BID  . azithromycin  500 mg Intravenous Q24H  . buPROPion  100 mg Oral q morning - 10a  . cefTRIAXone (ROCEPHIN)  IV  1 g Intravenous Q24H  . Chlorhexidine Gluconate Cloth  6 each Topical Daily  . dextrose  1 ampule Intravenous Once  . finasteride  5 mg Oral Daily  . fluticasone  1 spray Each Nare q morning - 10a  . hydrALAZINE  25 mg Oral BID    . hydrocerin  1 application Topical BID  . insulin aspart  0-9 Units Subcutaneous TID WC  . mulitivitamin with minerals  1 tablet Oral q morning - 10a  . mupirocin ointment  1 application Nasal BID  . pantoprazole (PROTONIX) IV  40 mg Intravenous Q12H  . sodium chloride  3 mL Intravenous Q12H  . terazosin  1 mg Oral QHS  . DISCONTD: buPROPion  150 mg Oral q morning - 10a     Assessment/ Plan:   1. Hyponatremia: Probably precipitated by hydrochlorothiazide use and will reevaluate serum and urine electrolytes /osmolality appear that this is compatible with SIADH. Will DC 3% saline and start PO lasix and leave him on fluid restriction. Would put HCTZ in medication intolerance list -"hyponatremia". For now, will hold lisinopril as this may interfere with appropriate correction of hyponatremia. Will need to be careful with volume if need colon prep. 2. Anemia: Significant iron deficiency appreciated, recommend intravenous iron therapy-further workup per gastroenterology/hospital service. No overt losses noted. Plans noted for endoscopy  in the near future given permissive sodium levels  3. Hypertension: Reevaluate antihypertensive therapy following discontinuation of lisinopril alternative agent such as hydralazine 3 times a day or twice a day.  4. Hypoglycemia: Resolved, continue to monitor closely and encourage oral intake.   Zetta Bills, MD 08/07/2011, 10:18 AM

## 2011-08-07 NOTE — Progress Notes (Addendum)
Physical Therapy Treatment Patient Details Name: Michael Yang MRN: [id number] DOB: April 14, 1923 Today's Date: 08/07/2011 Time: 1005-1030 PT Time Calculation (min): 25 min 2 gt  PT Assessment / Plan / Recommendation Comments on Treatment Session  Pt is very impulsive- tried to stand before therapist was ready, pulled leads off telemetry. Pt has very rapid speed with RW- too fast for safety purposes, so had to manually add resistance to RW to slow pt down. VCs were given to pt to center himself in RW during ambulation, and pt required assistance to turn the walker around corners.  VS were taken before ambulation while pt was sitting: HR 77, O2 sats 100%, BP 141/68;  during ambulation, HR 106, O2 sats 100%, and sitting after ambulation HR 102, O2 sats 100%.      Follow Up Recommendations  Other (comment) (case mngr asked PT after tx if he was ok to return to ALF. Will discuss with LPT if pts current level of mobility in regards to ALF vs SNF)          Plan   Return to [facility] with [facility] PT if  [facility] can provide appropriate level of care.   Precautions / Restrictions     Pertinent Vitals/Pain No c/o pain or lightheadedness    Mobility  Transfers Transfers: Sit to Stand;Stand to Sit Sit to Stand: 4: Min assist;4: Min guard Stand to Sit: 4: Min assist;With upper extremity assist;To chair/3-in-1 Details for Transfer Assistance: Pt is impulsive, will stand before therapist is ready.  VC and Manual assistance for UE placement for technique and saftety sit-->stand. Ambulation/Gait Ambulation/Gait Assistance: 4: Min assist Ambulation Distance (Feet): 450 Feet Assistive device: Rolling walker Ambulation/Gait Assistance Details: pt very fast- needed to resist his speed with RW for safety.  Had to give multiple VC to "get inside walker" to center himself and get proper distance from walker for safety and proper technique. Pt required help turning RW around corners Gait Pattern: Decreased  step length - right;Decreased step length - left;Decreased stride length;Shuffle;Trunk flexed;Step-through pattern Gait velocity: too fast from safety standpoint General Gait Details: Pt used shuffle/step through gait pattern at warp speed- had to slow RW down/add resistance to get pt to slow down for safety Stairs: No Wheelchair Mobility Wheelchair Mobility: No        PT Goals Acute Rehab PT Goals PT Goal Formulation: With patient Pt will go Sit to Stand: with supervision PT Goal: Sit to Stand - Progress: Progressing toward goal Pt will go Stand to Sit: with supervision PT Goal: Stand to Sit - Progress: Progressing toward goal Pt will Ambulate: >150 feet;with supervision;with rolling walker PT Goal: Ambulate - Progress: Progressing toward goal  Visit Information  Last PT Received On: 08/07/11 Assistance Needed: +2 (Follow with chair)    Subjective Data  Subjective: pt sitting in chair with sitter present.  Pt pulling on lead wires, had no c/o pain, lightheadedness.         End of Session PT - End of Session Equipment Utilized During Treatment: Gait belt Activity Tolerance: Patient tolerated treatment well Patient left: in chair;with call bell/phone within reach;Other (comment) (sitter in room)    Hiram Comber, SPTA 08/07/2011, 1:56 PM  Felecia Shelling  PTA [facility]  Acute  Rehab Pager     [phone]  Requires co signature of LPT due to D/C plans for AL vs SNF as initially reccommended. Agree with recommendations above. Drucilla Chalet, PT

## 2011-08-07 NOTE — Progress Notes (Signed)
CARE MANAGE MENT UTILIZATION REVIEW NOTE 08/07/2011     Patient:  [facility]   Account Number:  [id number]  Documented by:  Lanier Clam   Per Ur Regulation Reviewed for med. necessity/level of care/duration of stay

## 2011-08-07 NOTE — Progress Notes (Signed)
Tx 1506 by WC after reported to [facility].  Sitter w/ patient.

## 2011-08-07 NOTE — Progress Notes (Signed)
I have reviewed the above note, examined the patient and agree with plan of treatment. For EGD/colon tomorrow. Hgb up to 8.5

## 2011-08-07 NOTE — Progress Notes (Signed)
Subjective: Patient feels better today.  Denies any fever, chills, nausea, new focal neurological deficits, or BRBPR.    Objective: Filed Vitals:   08/07/11 0400 08/07/11 0800 08/07/11 1200 08/07/11 1340  BP: 136/58 152/63 146/58 141/68  Pulse: 81 93 85 77  Temp: 98.2 F (36.8 C) 98.1 F (36.7 C) 98.4 F (36.9 C)   TempSrc: Oral Oral Oral   Resp:  17 22   Height:      Weight:      SpO2: 100% 97% 100% 100%   Weight change: 1.3 kg (2 lb 13.9 oz)  Intake/Output Summary (Last 24 hours) at 08/07/11 1347 Last data filed at 08/07/11 1332  Gross per 24 hour  Intake   2555 ml  Output   1425 ml  Net   1130 ml    General: Alert, awake, in no acute distress.  HEENT: No bruits, no goiter.  Heart: Regular rate and rhythm, without murmurs, rubs, gallops.  Lungs: Clear to auscultation, bilateral air movement.  Abdomen: Soft, nontender, nondistended, positive bowel sounds.  Neuro: Grossly intact, nonfocal.   Lab Results:  Basename 08/07/11 0800 08/07/11 0336  NA 127* 127*  K 4.0 3.9  CL 97 97  CO2 22 22  GLUCOSE 96 89  BUN 13 14  CREATININE 0.87 0.88  CALCIUM 8.3* 8.5  MG -- --  PHOS -- --   No results found for this basename: AST:2,ALT:2,ALKPHOS:2,BILITOT:2,PROT:2,ALBUMIN:2 in the last 72 hours No results found for this basename: LIPASE:2,AMYLASE:2 in the last 72 hours  Basename 08/06/11 0340 08/05/11 0416  WBC 13.1* 11.0*  NEUTROABS -- --  HGB 8.5* 7.2*  HCT 26.5* 22.7*  MCV 75.3* 73.9*  PLT 372 336   No results found for this basename: CKTOTAL:3,CKMB:3,CKMBINDEX:3,TROPONINI:3 in the last 72 hours No components found with this basename: POCBNP:3 No results found for this basename: DDIMER:2 in the last 72 hours No results found for this basename: HGBA1C:2 in the last 72 hours No results found for this basename: CHOL:2,HDL:2,LDLCALC:2,TRIG:2,CHOLHDL:2,LDLDIRECT:2 in the last 72 hours No results found for this basename: TSH,T4TOTAL,FREET3,T3FREE,THYROIDAB in the last  72 hours No results found for this basename: VITAMINB12:2,FOLATE:2,FERRITIN:2,TIBC:2,IRON:2,RETICCTPCT:2 in the last 72 hours  Micro Results: Recent Results (from the past 240 hour(s))  MRSA PCR SCREENING     Status: Abnormal   Collection Time   08/04/11  2:17 AM      Component Value Range Status Comment   MRSA by PCR POSITIVE (*) NEGATIVE  Final     Studies/Results: No results found.  Medications: I have reviewed the patient's current medications.   Patient Active Hospital Problem List: GI bleed (08/03/2011)  GI on board and managing.  Will further evaluate patient with EGD/Colonoscopy tomorrow.  Anemia (08/03/2011) Multifactorial given #1 and Iron deficiency.  Patient will require Fe supplementation.  Given high profile for side effects with IV iron supplementation would prefer to try oral supplementation after GI studies tomorrow.  Should the patient develop any worsening in GI bleed then will administer IV Iron supplementation.  Hyponatremia (08/03/2011) Nephro managing:  Patient likely has SIADH.  Currently off of hypertonic saline and on fluid restriction and lasix.  Diabetes mellitus, type 2 (08/03/2011) Well controlled currently.  Will continue current regimen.  HTN (hypertension) (08/03/2011) Not well controlled and am considering adding hydralazine.  Will monitor for one more day before adding new agent.  Dementia (08/03/2011) Pt currently at baseline.     LOS: 4 days   Penny Pia M.D.  [facility] 08/07/2011, 1:47 PM

## 2011-08-08 ENCOUNTER — Encounter (HOSPITAL_COMMUNITY): Payer: Self-pay | Admitting: *Deleted

## 2011-08-08 ENCOUNTER — Encounter (HOSPITAL_COMMUNITY)
Admission: EM | Disposition: A | Discharge status: Nursing Home (Includes ICF) | Payer: Self-pay | Source: Home / Self Care | Attending: Family Medicine

## 2011-08-08 DIAGNOSIS — R112 Nausea with vomiting, unspecified: Secondary | ICD-10-CM

## 2011-08-08 DIAGNOSIS — E871 Hypo-osmolality and hyponatremia: Secondary | ICD-10-CM

## 2011-08-08 DIAGNOSIS — G309 Alzheimer's disease, unspecified: Secondary | ICD-10-CM

## 2011-08-08 DIAGNOSIS — F0280 Dementia in other diseases classified elsewhere without behavioral disturbance: Secondary | ICD-10-CM

## 2011-08-08 DIAGNOSIS — R05 Cough: Secondary | ICD-10-CM

## 2011-08-08 HISTORY — PX: ESOPHAGOGASTRODUODENOSCOPY: SHX5428

## 2011-08-08 HISTORY — PX: COLONOSCOPY: SHX5424

## 2011-08-08 LAB — BASIC METABOLIC PANEL WITH GFR
BUN: 11 mg/dL (ref 6–23)
BUN: 10 mg/dL (ref 6–23)
CO2: 21 meq/L (ref 19–32)
CO2: 22 meq/L (ref 19–32)
Calcium: 8.9 mg/dL (ref 8.4–10.5)
Calcium: 8.4 mg/dL (ref 8.4–10.5)
Chloride: 94 meq/L — ABNORMAL LOW (ref 96–112)
Chloride: 98 meq/L (ref 96–112)
Creatinine, Ser: 0.83 mg/dL (ref 0.50–1.35)
Creatinine, Ser: 0.83 mg/dL (ref 0.50–1.35)
GFR calc Af Amer: 90 mL/min — ABNORMAL LOW
GFR calc Af Amer: 90 mL/min — ABNORMAL LOW
GFR calc non Af Amer: 77 mL/min — ABNORMAL LOW
GFR calc non Af Amer: 77 mL/min — ABNORMAL LOW
Glucose, Bld: 91 mg/dL (ref 70–99)
Glucose, Bld: 129 mg/dL — ABNORMAL HIGH (ref 70–99)
Potassium: 3.2 meq/L — ABNORMAL LOW (ref 3.5–5.1)
Potassium: 3.5 meq/L (ref 3.5–5.1)
Sodium: 128 meq/L — ABNORMAL LOW (ref 135–145)
Sodium: 131 meq/L — ABNORMAL LOW (ref 135–145)

## 2011-08-08 LAB — MAGNESIUM: Magnesium: 1.8 mg/dL (ref 1.5–2.5)

## 2011-08-08 LAB — GLUCOSE, CAPILLARY
Glucose-Capillary: 85 mg/dL (ref 70–99)
Glucose-Capillary: 82 mg/dL (ref 70–99)

## 2011-08-08 SURGERY — COLONOSCOPY
Anesthesia: Moderate Sedation

## 2011-08-08 MED ORDER — BUTAMBEN-TETRACAINE-BENZOCAINE 2-2-14 % EX AERO
INHALATION_SPRAY | CUTANEOUS | Status: DC | PRN
  Administered 2011-08-08: 2 via TOPICAL

## 2011-08-08 MED ORDER — MIDAZOLAM HCL 10 MG/2ML IJ SOLN
INTRAMUSCULAR | Status: AC
  Filled 2011-08-08: qty 4

## 2011-08-08 MED ORDER — FENTANYL CITRATE 0.05 MG/ML IJ SOLN
INTRAMUSCULAR | Status: AC
  Filled 2011-08-08: qty 2

## 2011-08-08 MED ORDER — FERROUS SULFATE 325 (65 FE) MG PO TABS
325.0000 mg | ORAL_TABLET | Freq: Three times a day (TID) | ORAL | Status: DC
  Administered 2011-08-09 – 2011-08-11 (×7): 325 mg via ORAL
  Filled 2011-08-08 (×11): qty 1

## 2011-08-08 MED ORDER — POTASSIUM CHLORIDE CRYS ER 20 MEQ PO TBCR
40.0000 meq | EXTENDED_RELEASE_TABLET | Freq: Two times a day (BID) | ORAL | Status: AC
  Administered 2011-08-08 – 2011-08-09 (×2): 40 meq via ORAL
  Filled 2011-08-08 (×2): qty 2

## 2011-08-08 MED ORDER — DIPHENHYDRAMINE HCL 50 MG/ML IJ SOLN
INTRAMUSCULAR | Status: AC
  Filled 2011-08-08: qty 1

## 2011-08-08 MED ORDER — MIDAZOLAM HCL 10 MG/2ML IJ SOLN
INTRAMUSCULAR | Status: AC
  Filled 2011-08-08: qty 2

## 2011-08-08 MED ORDER — SODIUM CHLORIDE 0.45 % IV SOLN: Freq: Once | INTRAVENOUS | Status: DC

## 2011-08-08 MED ORDER — SODIUM CHLORIDE 0.9 % IV SOLN
Freq: Once | INTRAVENOUS | Status: AC
  Administered 2011-08-08: 500 mL via INTRAVENOUS

## 2011-08-08 MED ORDER — FENTANYL CITRATE 0.05 MG/ML IJ SOLN
INTRAMUSCULAR | Status: AC
  Filled 2011-08-08: qty 4

## 2011-08-08 MED ORDER — FENTANYL NICU IV SYRINGE 50 MCG/ML
INJECTION | INTRAMUSCULAR | Status: DC | PRN
  Administered 2011-08-08 (×3): 12.5 ug via INTRAVENOUS

## 2011-08-08 MED ORDER — MIDAZOLAM HCL 10 MG/2ML IJ SOLN
INTRAMUSCULAR | Status: DC | PRN
  Administered 2011-08-08 (×3): 1 mg via INTRAVENOUS

## 2011-08-08 MED ORDER — POTASSIUM CHLORIDE CRYS ER 20 MEQ PO TBCR
40.0000 meq | EXTENDED_RELEASE_TABLET | Freq: Two times a day (BID) | ORAL | Status: DC
  Filled 2011-08-08 (×2): qty 2

## 2011-08-08 NOTE — Op Note (Signed)
[facility] [address] [place], [place]  [address]  COLONOSCOPY PROCEDURE REPORT  PATIENT:  Michael Yang, Michael Yang  MR#:  [id number] BIRTHDATE:  25-Aug-1923, 88 yrs. old  GENDER:  male ENDOSCOPIST:  Hedwig Morton. Juanda Chance, MD REF. BY: PROCEDURE DATE:  08/08/2011 PROCEDURE:  Colonoscopy 62130 ASA CLASS:  Class III INDICATIONS:  anemia MEDICATIONS:   These medications were titrated to patient response per physician's verbal order, Versed 1 mg, Fentanyl 25 mcg  DESCRIPTION OF PROCEDURE:   After the risks and benefits and of the procedure were explained, informed consent was obtained. Digital rectal exam was performed and revealed no rectal masses. The Pentax Ped Colon [id number] endoscope was introduced through the anus and advanced to the cecum, which was identified by both the appendix and ileocecal valve.  The quality of the prep was good, using MoviPrep.  The instrument was then slowly withdrawn as the colon was fully examined. <<PROCEDUREIMAGES>>  FINDINGS:  No polyps or cancers were seen (see image1, image2, and image3).   Retroflexed views in the rectum revealed no abnormalities.    The scope was then withdrawn from the patient and the procedure completed.  COMPLICATIONS:  None ENDOSCOPIC IMPRESSION: 1) No polyps or cancers 2) Normal colonoscopy RECOMMENDATIONS: since we did not find the source of bleeding, he will have to be followed as an outpatient for response to Iron and recheck his hemoccults. Consider SBCEndoscopy if occult blood loss continues  REPEAT EXAM:  In 0 year(s) for.  ______________________________ Hedwig Morton. Juanda Chance, MD  CC:  n. eSIGNED:   Hedwig Morton. Anastasija Anfinson at 08/08/2011 05:08 PM  Evalee Mutton, [phone]

## 2011-08-08 NOTE — Progress Notes (Signed)
EGD and Colonoscopy completed. Please see Pprocedure note. Both procedures were normal, nothing to account for severe anemia. Will resume diet, Start iron supplements and follow H/H and stool hemoccults. Consider SBCE to evaluate further.Will sign off.

## 2011-08-08 NOTE — Progress Notes (Signed)
Patient ID: Michael Yang, male   DOB: 04-Dec-1923, 78 y.o.   MRN: [id number]   [facility] Progress Note    Subjective:   Reports to be feeling well- colonoscopy today (awaiting to be wheeled away)   Objective:   BP 154/57  Pulse 82  Temp(Src) 98.1 F (36.7 C) (Oral)  Resp 17  Ht 5\' 3"  (1.6 m)  Wt 71.079 kg (156 lb 11.2 oz)  BMI 27.76 kg/m2  SpO2 100%  Intake/Output Summary (Last 24 hours) at 08/08/11 1130 Last data filed at 08/07/11 1700  Gross per 24 hour  Intake   1020 ml  Output    900 ml  Net    120 ml   Weight change: -1.821 kg (-4 lb 0.3 oz)  Physical Exam: WUJ:WJXBJYNWGNF sitting up in recliner AOZ:HYQMV RRR, normal S1 and S2 Resp:CTA bilaterally, no rales/rhonchi HQI:ONGE, obese, NT, BS normal Ext:1+ LE edema  Imaging: No results found.  Labs: BMET  Lab 08/08/11 0840 08/07/11 1942 08/07/11 0800 08/07/11 0336 08/07/11 08/06/11 1950 08/06/11 1605  NA 128* 125* 127* 127* 127* 125* 125*  K 3.2* 3.7 4.0 3.9 4.1 4.2 4.1  CL 94* 92* 97 97 97 94* 95*  CO2 21 23 22 22 21 22 20   GLUCOSE 91 88 96 89 94 101* 100*  BUN 11 12 13 14 15 15 14   CREATININE 0.83 0.90 0.87 0.88 0.89 1.01 0.95  ALB -- -- -- -- -- -- --  CALCIUM 8.9 8.5 8.3* 8.5 8.3* 8.6 8.1*  PHOS -- -- -- -- -- -- --   CBC  Lab 08/06/11 0340 08/05/11 0416 08/04/11 1808 08/04/11 0630  WBC 13.1* 11.0* 12.4* 11.5*  NEUTROABS -- -- -- --  HGB 8.5* 7.2* 7.6* 7.7*  HCT 26.5* 22.7* 23.5* 23.5*  MCV 75.3* 73.9* 73.7* 73.7*  PLT 372 336 310 370    Medications:      . antiseptic oral rinse  15 mL Mouth Rinse BID  . azithromycin  500 mg Intravenous Q24H  . buPROPion  100 mg Oral q morning - 10a  . Chlorhexidine Gluconate Cloth  6 each Topical Daily  . dextrose  1 ampule Intravenous Once  . finasteride  5 mg Oral Daily  . fluticasone  1 spray Each Nare q morning - 10a  . furosemide  20 mg Oral Daily  . hydrALAZINE  25 mg Oral BID  . hydrocerin  1 application Topical BID  . insulin  aspart  0-9 Units Subcutaneous TID WC  . mulitivitamin with minerals  1 tablet Oral q morning - 10a  . mupirocin ointment  1 application Nasal BID  . pantoprazole  40 mg Oral Q1200  . peg 3350 powder  1 kit Oral Once  . sodium chloride  3 mL Intravenous Q12H  . terazosin  1 mg Oral QHS  . DISCONTD: cefTRIAXone (ROCEPHIN)  IV  1 g Intravenous Q24H     Assessment/ Plan:   1. Hyponatremia: Probably precipitated by hydrochlorothiazide use. Improving on PO lasix and fluid restriction. Would put HCTZ in medication intolerance list -"hyponatremia". For now, will hold lisinopril as this may interfere with appropriate correction of hyponatremia.   2. Anemia: Significant iron deficiency appreciated, recommend intravenous iron therapy-further workup per gastroenterology/hospital service. No overt losses noted. Plans noted for endoscopy in the near future given permissive sodium levels  3. Hypertension: Reevaluate antihypertensive therapy following discontinuation of lisinopril alternative agent such as hydralazine 3 times a day or twice a day.  4.  Hypoglycemia: Resolved, continue to monitor closely and encourage oral intake 5. Hypokalemia: Due to limited intake/GI losses, plan for PO repletion after endoscopy.   Zetta Bills, MD 08/08/2011, 11:30 AM

## 2011-08-08 NOTE — Progress Notes (Signed)
Occupational Therapy Note Chart reviewed. Spoke with nursing who requests that OT check back later as pt getting ready for colonoscopy. Will try back as schedule permits. Judithann Sauger OTR/L [phone] 08/08/2011

## 2011-08-08 NOTE — Progress Notes (Signed)
CSW spoke to admissions coordinator at [facility]. They are not able to take patient back over the weekend.They can only accept patient to return on a week day. CSW to follow for possible discharge on Monday.  Michael Yang C. Lakiah Dhingra MSW, LCSW [phone]

## 2011-08-08 NOTE — Progress Notes (Signed)
Subjective: Patient just got to the floor from recent upper EGD and colonoscopy.  No acute issues reported. Patient is currently resting comfortably.  Objective: Filed Vitals:   08/08/11 1655 08/08/11 1700 08/08/11 1710 08/08/11 1720  BP: 132/70 145/73 145/73 111/66  Pulse:      Temp:      TempSrc:      Resp: 13 10 12 14   Height:      Weight:      SpO2: 100% 96% 93% 94%   Weight change: -1.821 kg (-4 lb 0.3 oz) No intake or output data in the 24 hours ending 08/08/11 1818  General: in no acute distress.  HEENT: No bruits, no goiter.  Heart: Regular rate and rhythm, without murmurs, rubs, gallops.  Lungs: Clear to auscultation, bilateral air movement.  Abdomen: Soft, nontender, nondistended, positive bowel sounds.  Neuro: Grossly intact, nonfocal.   Lab Results:  Basename 08/08/11 0840 08/08/11 0225 08/07/11 1942  NA 128* -- 125*  K 3.2* -- 3.7  CL 94* -- 92*  CO2 21 -- 23  GLUCOSE 91 -- 88  BUN 11 -- 12  CREATININE 0.83 -- 0.90  CALCIUM 8.9 -- 8.5  MG -- 1.8 --  PHOS -- -- --   No results found for this basename: AST:2,ALT:2,ALKPHOS:2,BILITOT:2,PROT:2,ALBUMIN:2 in the last 72 hours No results found for this basename: LIPASE:2,AMYLASE:2 in the last 72 hours  Basename 08/06/11 0340  WBC 13.1*  NEUTROABS --  HGB 8.5*  HCT 26.5*  MCV 75.3*  PLT 372   No results found for this basename: CKTOTAL:3,CKMB:3,CKMBINDEX:3,TROPONINI:3 in the last 72 hours No components found with this basename: POCBNP:3 No results found for this basename: DDIMER:2 in the last 72 hours No results found for this basename: HGBA1C:2 in the last 72 hours No results found for this basename: CHOL:2,HDL:2,LDLCALC:2,TRIG:2,CHOLHDL:2,LDLDIRECT:2 in the last 72 hours No results found for this basename: TSH,T4TOTAL,FREET3,T3FREE,THYROIDAB in the last 72 hours No results found for this basename: VITAMINB12:2,FOLATE:2,FERRITIN:2,TIBC:2,IRON:2,RETICCTPCT:2 in the last 72 hours  Micro Results: Recent  Results (from the past 240 hour(s))  MRSA PCR SCREENING     Status: Abnormal   Collection Time   08/04/11  2:17 AM      Component Value Range Status Comment   MRSA by PCR POSITIVE (*) NEGATIVE  Final     Studies/Results: No results found.  Medications: I have reviewed the patient's current medications.   Patient Active Hospital Problem List: GI bleed (08/03/2011) GI on board.  Colonoscopy and upper endoscopy did not show any causes.  Patient will need to follow up as outpatient and may have to obtain SBCE as indicated by GI.  Anemia (08/03/2011) Blood loss and Fe deficiency anemia.  Currently patient is not bleeding.  Has started Iron replacement therapy today.  Hyponatremia (08/03/2011) Improving on current regimen.  Will plan on continuing. Nephro managing.  Diabetes mellitus, type 2 (08/03/2011) Stable currently  HTN (hypertension) (08/03/2011) Well controlled given last reported reading. 111/16  Dementia (08/03/2011) Patient is currently at baseline  Disposition:  Will plan on transferring to SNF tomorrow if bed available and medically stable.   LOS: 5 days   Penny Pia M.D.  [facility] 08/08/2011, 6:18 PM

## 2011-08-08 NOTE — Progress Notes (Signed)
CSW continues to follow for return to ALF. CSW spoke with PT and they feel Pt can return to his ALF at discharge. CSW transferring case to [place] CSW, [place]. CSW to follow for return to SNF.  Vennie Homans, [place] 08/08/2011 8:41 AM [phone]

## 2011-08-08 NOTE — Op Note (Signed)
[facility] [address] [place], [place]  [address]  ENDOSCOPY PROCEDURE REPORT  PATIENT:  Michael Yang, Michael Yang  MR#:  [id number] BIRTHDATE:  [birth year], 88 yrs. old  GENDER:  male  ENDOSCOPIST:  Hedwig Morton. Juanda Chance, MD Referred by:  PROCEDURE DATE:  08/08/2011 PROCEDURE:  EGD, diagnostic 43235 ASA CLASS:  Class II INDICATIONS:  heme positive anemia  MEDICATIONS:   These medications were titrated to patient response per physician's verbal order, Versed 2 mg, Fentanyl 25 mcg TOPICAL ANESTHETIC:  DESCRIPTION OF PROCEDURE:   After the risks benefits and alternatives of the procedure were thoroughly explained, informed consent was obtained.  The Pentax Gastroscope [id number] endoscope was introduced through the mouth and advanced to the second portion of the duodenum, without limitations.  The instrument was slowly withdrawn as the mucosa was fully examined. <<PROCEDUREIMAGES>>  The upper, middle, and distal third of the esophagus were carefully inspected and no abnormalities were noted. The z-line was well seen at the GEJ. The endoscope was pushed into the fundus which was normal including a retroflexed view. The antrum,gastric body, first and second part of the duodenum were unremarkable (see image1, image2, image3, image4, and image5).  The upper, middle, and distal third of the esophagus were carefully inspected and no abnormalities were noted. The z-line was well seen at the GEJ. The endoscope was pushed into the fundus which was normal including a retroflexed view. The antrum,gastric body, first and second part of the duodenum were unremarkable (see image1, image2, image3, image4, and image5).    Retroflexed views revealed no abnormalities.    The scope was then withdrawn from the patient and the procedure completed.  COMPLICATIONS:  None  ENDOSCOPIC IMPRESSION: 1) Normal EGD RECOMMENDATIONS: colonoscopy  REPEAT EXAM:  In 0 year(s)  for.  ______________________________ Hedwig Morton. Juanda Chance, MD  CC:  n. eSIGNED:   Hedwig Morton. Tyesha Joffe at 08/08/2011 04:59 PM  Evalee Mutton, [phone]

## 2011-08-08 NOTE — Interval H&P Note (Signed)
History and Physical Interval Note:  08/08/2011 4:22 PM  Michael Yang  has presented today for surgery, with the diagnosis of anemia  The various methods of treatment have been discussed with the patient and family. After consideration of risks, benefits and other options for treatment, the patient has consented to  Procedure(s) (LRB): COLONOSCOPY (N/A) ESOPHAGOGASTRODUODENOSCOPY (EGD) (N/A) as a surgical intervention .  The patients' history has been reviewed, patient examined, no change in status, stable for surgery.  I have reviewed the patients' chart and labs.  Questions were answered to the patient's satisfaction.     [name]

## 2011-08-09 DIAGNOSIS — E871 Hypo-osmolality and hyponatremia: Secondary | ICD-10-CM

## 2011-08-09 DIAGNOSIS — F0280 Dementia in other diseases classified elsewhere without behavioral disturbance: Secondary | ICD-10-CM

## 2011-08-09 DIAGNOSIS — E876 Hypokalemia: Secondary | ICD-10-CM | POA: Diagnosis not present

## 2011-08-09 DIAGNOSIS — G309 Alzheimer's disease, unspecified: Secondary | ICD-10-CM

## 2011-08-09 DIAGNOSIS — R059 Cough, unspecified: Secondary | ICD-10-CM

## 2011-08-09 DIAGNOSIS — R112 Nausea with vomiting, unspecified: Secondary | ICD-10-CM

## 2011-08-09 DIAGNOSIS — R05 Cough: Secondary | ICD-10-CM

## 2011-08-09 LAB — BASIC METABOLIC PANEL
BUN: 12 mg/dL (ref 6–23)
CO2: 21 mEq/L (ref 19–32)
Calcium: 8.8 mg/dL (ref 8.4–10.5)
Chloride: 96 mEq/L (ref 96–112)
Chloride: 95 mEq/L — ABNORMAL LOW (ref 96–112)
GFR calc Af Amer: 86 mL/min — ABNORMAL LOW (ref >90)
GFR calc Af Amer: 85 mL/min — ABNORMAL LOW (ref >90)
GFR calc non Af Amer: 73 mL/min — ABNORMAL LOW (ref >90)
Potassium: 4.3 mEq/L (ref 3.5–5.1)
Sodium: 128 mEq/L — ABNORMAL LOW (ref 135–145)
Sodium: 128 mEq/L — ABNORMAL LOW (ref 135–145)

## 2011-08-09 LAB — CBC
MCV: 75.7 fL — ABNORMAL LOW (ref 78.0–100.0)
Platelets: 334 10*3/uL (ref 150–400)
RBC: 3.46 MIL/uL — ABNORMAL LOW (ref 4.22–5.81)
RDW: 17.9 % — ABNORMAL HIGH (ref 11.5–15.5)
WBC: 7.7 10*3/uL (ref 4.0–10.5)

## 2011-08-09 LAB — GLUCOSE, CAPILLARY
Glucose-Capillary: 90 mg/dL (ref 70–99)
Glucose-Capillary: 92 mg/dL (ref 70–99)

## 2011-08-09 MED ORDER — SODIUM CHLORIDE 0.9 % IV SOLN
1020.0000 mg | Freq: Once | INTRAVENOUS | Status: AC
  Administered 2011-08-09: 1020 mg via INTRAVENOUS
  Filled 2011-08-09: qty 34

## 2011-08-09 MED ORDER — AZITHROMYCIN 500 MG PO TABS
500.0000 mg | ORAL_TABLET | ORAL | Status: DC
Start: 2011-08-09 — End: 2011-08-09
  Filled 2011-08-09: qty 1

## 2011-08-09 MED ORDER — NICOTINE 21 MG/24HR TD PT24
21.0000 mg | MEDICATED_PATCH | Freq: Every day | TRANSDERMAL | Status: DC
  Administered 2011-08-09 – 2011-08-11 (×3): 21 mg via TRANSDERMAL
  Filled 2011-08-09 (×3): qty 1

## 2011-08-09 NOTE — Progress Notes (Signed)
I have reviewed the above note, examined the patient and agree with plan of treatment.will sign off

## 2011-08-09 NOTE — Progress Notes (Signed)
Subjective: Was informed by ALF that they did not accept people over the weekend.  Thus patient would have to remain at the hospital over the weekend.  Patient has no acute complaints.  Reportedly he is a heavy smoker smoking 1.5 packs per day.  Objective: Filed Vitals:   08/08/11 1720 08/08/11 2242 08/09/11 0523 08/09/11 1357  BP: 111/66 145/73 149/75 152/86  Pulse:  78 74 78  Temp:  99.2 F (37.3 C) 98.3 F (36.8 C) 98.2 F (36.8 C)  TempSrc:  Oral Oral Oral  Resp: 14 17 18 16   Height:      Weight:      SpO2: 94% 100% 100% 98%   Weight change:   Intake/Output Summary (Last 24 hours) at 08/09/11 1513 Last data filed at 08/09/11 1211  Gross per 24 hour  Intake    840 ml  Output   1300 ml  Net   -460 ml    General: Alert, awake, in no acute distress.  HEENT: No bruits, no goiter.  Heart: Regular rate and rhythm, without murmurs, rubs, gallops.  Lungs: Clear to auscultation, bilateral air movement.  Abdomen: Soft, nontender, nondistended, positive bowel sounds.  Neuro: Grossly intact, nonfocal.   Lab Results:  Basename 08/09/11 0546 08/08/11 2005 08/08/11 0225  NA 128* 131* --  K 3.7 3.5 --  CL 96 98 --  CO2 21 22 --  GLUCOSE 88 129* --  BUN 11 10 --  CREATININE 0.93 0.83 --  CALCIUM 8.8 8.4 --  MG -- -- 1.8  PHOS -- -- --   No results found for this basename: AST:2,ALT:2,ALKPHOS:2,BILITOT:2,PROT:2,ALBUMIN:2 in the last 72 hours No results found for this basename: LIPASE:2,AMYLASE:2 in the last 72 hours  Basename 08/09/11 0546  WBC 7.7  NEUTROABS --  HGB 8.3*  HCT 26.2*  MCV 75.7*  PLT 334   No results found for this basename: CKTOTAL:3,CKMB:3,CKMBINDEX:3,TROPONINI:3 in the last 72 hours No components found with this basename: POCBNP:3 No results found for this basename: DDIMER:2 in the last 72 hours No results found for this basename: HGBA1C:2 in the last 72 hours No results found for this basename: CHOL:2,HDL:2,LDLCALC:2,TRIG:2,CHOLHDL:2,LDLDIRECT:2 in  the last 72 hours No results found for this basename: TSH,T4TOTAL,FREET3,T3FREE,THYROIDAB in the last 72 hours No results found for this basename: VITAMINB12:2,FOLATE:2,FERRITIN:2,TIBC:2,IRON:2,RETICCTPCT:2 in the last 72 hours  Micro Results: Recent Results (from the past 240 hour(s))  MRSA PCR SCREENING     Status: Abnormal   Collection Time   08/04/11  2:17 AM      Component Value Range Status Comment   MRSA by PCR POSITIVE (*) NEGATIVE  Final     Studies/Results: No results found.  Medications: I have reviewed the patient's current medications.   Patient Active Hospital Problem List: GI bleed (08/03/2011) Resolved and GI has signed off. Etiology uncertain at this juncture. Patient will require follow up as outpatient.   Anemia (08/03/2011) Currently on Iron supplement. Needs CBC by primary in 6 weeks unless he has recurrence in bleeding.  Hyponatremia (08/03/2011) Currently being managed by Nephro. Pt is currently stable.  Hypokalemia:  Currently being orally replaced and resolved.  May have been secondary to lasix administration.  Diabetes mellitus, type 2 (08/03/2011) Stable currently, will continue current regimen.  HTN (hypertension) (08/03/2011) Will continue current regimen and monitor.  Patient's BP has ranged from 111/66-152/86  Dementia (08/03/2011) Stable and at baseline.     LOS: 6 days   Penny Pia M.D.  [facility] 08/09/2011, 3:13 PM

## 2011-08-09 NOTE — Progress Notes (Signed)
[facility] Progress Note  SUBJECTIVE: feels okay today  OBJECTIVE:  Vital signs in last 24 hours: Temp:  [97.8 F (36.6 C)-99.2 F (37.3 C)] 98.3 F (36.8 C) (05/04 0523) Pulse Rate:  [74-92] 74  (05/04 0523) Resp:  [10-18] 18  (05/04 0523) BP: (111-174)/(63-91) 149/75 mmHg (05/04 0523) SpO2:  [93 %-100 %] 100 % (05/04 0523) Last BM Date: 08/08/11 General:    black male in NAD Lungs: Respirations even and unlabored, decreased breath sounds bilaterally Abdomen:  Soft, nontender and nondistended. Normal bowel sounds. Extremities:  Without edema. Neurologic:  Alert, grossly normal neurologically. Psych:  Cooperative.   Lab Results:  [facility] 08/09/11 0546  WBC 7.7  HGB 8.3*  HCT 26.2*  PLT 334   BMET  Basename 08/09/11 0546 08/08/11 2005 08/08/11 0840  NA 128* 131* 128*  K 3.7 3.5 3.2*  CL 96 98 94*  CO2 21 22 21   GLUCOSE 88 129* 91  BUN 11 10 11   CREATININE 0.93 0.83 0.83  CALCIUM 8.8 8.4 8.9     ASSESSMENT / PLAN:  61. 78 year old male with iron deficiency anemia, heme positive stools. His EGD and colonoscopy yesterday were normal. Agree with oral iron replacement. He needs a follow up CBC by primary doctor in 6-8 weeks. If patient has recurrent anemia then we may pursue a small bowel capsule study.  2. hyponatremia,  3. Dementia    LOS: 6 days   Willette Cluster  08/09/2011, 9:25 AM

## 2011-08-09 NOTE — Progress Notes (Signed)
Patient ID: Michael Yang, male   DOB: Jul 27, 1923, 78 y.o.   MRN: [id number]   [facility] Progress Note    Subjective:   Noted to be a little agitated and fidgety this morning- the patient himself reports to be feeling fine.    Objective:   BP 149/75  Pulse 74  Temp(Src) 98.3 F (36.8 C) (Oral)  Resp 18  Ht 5\' 3"  (1.6 m)  Wt 71.079 kg (156 lb 11.2 oz)  BMI 27.76 kg/m2  SpO2 100%  Intake/Output Summary (Last 24 hours) at 08/09/11 1029 Last data filed at 08/09/11 0841  Gross per 24 hour  Intake    600 ml  Output   1000 ml  Net   -400 ml   Weight change:   Physical Exam: WUJ:[id number] resting in bed  AOZ:HYQMV RRR, S1 and S2 with ESM Resp:CTA bilaterally, no rales HQI:ONGE, flat, NT, BS normal Ext: Trace-1+ LE edema  Imaging: No results found.  Labs: BMET  Lab 08/09/11 0546 08/08/11 2005 08/08/11 0840 08/07/11 1942 08/07/11 0800 08/07/11 0336 08/07/11  NA 128* 131* 128* 125* 127* 127* 127*  K 3.7 3.5 3.2* 3.7 4.0 3.9 4.1  CL 96 98 94* 92* 97 97 97  CO2 21 22 21 23 22 22 21   GLUCOSE 88 129* 91 88 96 89 94  BUN 11 10 11 12 13 14 15   CREATININE 0.93 0.83 0.83 0.90 0.87 0.88 0.89  ALB -- -- -- -- -- -- --  CALCIUM 8.8 8.4 8.9 8.5 8.3* 8.5 8.3*  PHOS -- -- -- -- -- -- --   CBC  Lab 08/09/11 0546 08/06/11 0340 08/05/11 0416 08/04/11 1808  WBC 7.7 13.1* 11.0* 12.4*  NEUTROABS -- -- -- --  HGB 8.3* 8.5* 7.2* 7.6*  HCT 26.2* 26.5* 22.7* 23.5*  MCV 75.7* 75.3* 73.9* 73.7*  PLT 334 372 336 310    Medications:      . sodium chloride   Intravenous Once  . antiseptic oral rinse  15 mL Mouth Rinse BID  . azithromycin  500 mg Oral Q24H  . buPROPion  100 mg Oral q morning - 10a  . dextrose  1 ampule Intravenous Once  . ferrous sulfate  325 mg Oral TID WC  . finasteride  5 mg Oral Daily  . fluticasone  1 spray Each Nare q morning - 10a  . furosemide  20 mg Oral Daily  . hydrALAZINE  25 mg Oral BID  . hydrocerin  1 application Topical BID  .  insulin aspart  0-9 Units Subcutaneous TID WC  . mulitivitamin with minerals  1 tablet Oral q morning - 10a  . mupirocin ointment  1 application Nasal BID  . pantoprazole  40 mg Oral Q1200  . potassium chloride  40 mEq Oral BID  . sodium chloride  3 mL Intravenous Q12H  . terazosin  1 mg Oral QHS  . DISCONTD: sodium chloride   Intravenous Once  . DISCONTD: azithromycin  500 mg Intravenous Q24H  . DISCONTD: potassium chloride  40 mEq Oral BID     Assessment/ Plan:   1. Hyponatremia: Probably precipitated by hydrochlorothiazide use. Improving on PO lasix and fluid restriction. For now, will hold lisinopril as this may interfere with appropriate correction of hyponatremia.  2. Anemia: Significant iron deficiency appreciated, will give intravenous iron. GI workup with EGD and colonoscopy negative for source of bleed.  3. Hypertension: Reevaluate antihypertensive therapy following discontinuation of lisinopril alternative agent such as hydralazine twice a  day.  4. Hypoglycemia: Resolved, continue to monitor closely and encourage oral intake  5. Hypokalemia: Repleted by PO route 6. Agitation: Suspect nicotine withdrawal VS sundowning- start nicoderm patch   Will sign off for now- continue fluid restriction and  Lasix upon DC. Labs biweekly to f/u Na. DC HCTZ indefinitely.  Zetta Bills, MD 08/09/2011, 10:29 AM

## 2011-08-09 NOTE — Progress Notes (Signed)
PHARMACIST - PHYSICIAN COMMUNICATION DR:   Cena Benton CONCERNING: Antibiotic IV to Oral Route Change Policy  RECOMMENDATION: This patient is receiving azithromycin by the intravenous route.  Based on criteria approved by the Pharmacy and Therapeutics Committee, the antibiotic(s) is/are being converted to the equivalent oral dose form(s).   DESCRIPTION: These criteria include:  Patient being treated for a respiratory tract infection, urinary tract infection, or cellulitis  The patient is not neutropenic and does not exhibit a GI malabsorption state  The patient is eating (either orally or via tube) and/or has been taking other orally administered medications for a least 24 hours  The patient is improving clinically and has a Tmax < 100.5  If you have questions about this conversion, please contact the Pharmacy Department  []   [phone] )  [facility] []   [phone] )  [facility]  []   [phone] )  [facility] [x]   [phone] )  [facility]

## 2011-08-10 DIAGNOSIS — R05 Cough: Secondary | ICD-10-CM

## 2011-08-10 DIAGNOSIS — R112 Nausea with vomiting, unspecified: Secondary | ICD-10-CM

## 2011-08-10 DIAGNOSIS — E871 Hypo-osmolality and hyponatremia: Secondary | ICD-10-CM

## 2011-08-10 DIAGNOSIS — G309 Alzheimer's disease, unspecified: Secondary | ICD-10-CM

## 2011-08-10 DIAGNOSIS — F0280 Dementia in other diseases classified elsewhere without behavioral disturbance: Secondary | ICD-10-CM

## 2011-08-10 LAB — GLUCOSE, CAPILLARY: Glucose-Capillary: 146 mg/dL — ABNORMAL HIGH (ref 70–99)

## 2011-08-10 LAB — BASIC METABOLIC PANEL
BUN: 9 mg/dL (ref 6–23)
BUN: 13 mg/dL (ref 6–23)
CO2: 23 mEq/L (ref 19–32)
CO2: 23 mEq/L (ref 19–32)
Calcium: 9.4 mg/dL (ref 8.4–10.5)
Calcium: 8.8 mg/dL (ref 8.4–10.5)
Creatinine, Ser: 0.86 mg/dL (ref 0.50–1.35)
GFR calc non Af Amer: 76 mL/min — ABNORMAL LOW (ref >90)
GFR calc non Af Amer: 62 mL/min — ABNORMAL LOW (ref >90)
Glucose, Bld: 87 mg/dL (ref 70–99)
Glucose, Bld: 119 mg/dL — ABNORMAL HIGH (ref 70–99)
Sodium: 126 mEq/L — ABNORMAL LOW (ref 135–145)

## 2011-08-10 MED ORDER — SODIUM CHLORIDE 1 G PO TABS
1.0000 g | ORAL_TABLET | Freq: Once | ORAL | Status: AC
  Administered 2011-08-10: 1 g via ORAL
  Filled 2011-08-10: qty 1

## 2011-08-10 NOTE — Progress Notes (Signed)
Subjective: At this point patient has no complaints.  States "Im feeling fine."  Objective: Filed Vitals:   08/09/11 1357 08/09/11 2200 08/09/11 2205 08/10/11 0552  BP: 152/86 164/77 164/77 120/89  Pulse: 78 66  73  Temp: 98.2 F (36.8 C) 98.6 F (37 C)  97.3 F (36.3 C)  TempSrc: Oral Oral  Oral  Resp: 16 18  16   Height:      Weight:      SpO2: 98% 100%  96%   Weight change:   Intake/Output Summary (Last 24 hours) at 08/10/11 1835 Last data filed at 08/10/11 1700  Gross per 24 hour  Intake    923 ml  Output   1050 ml  Net   -127 ml    General: Alert, awake, n no acute distress. HEENT: No bruits, no goiter.  Heart: Regular rate and rhythm, without murmurs, rubs, gallops.  Lungs: clear to auscultation, bilateral air movement.  Abdomen: Soft, nontender, nondistended, positive bowel sounds.  Neuro: Patient responds to questions appropriately.  No new focal findings.   Lab Results:  Basename 08/10/11 0816 08/09/11 2000 08/08/11 0225  NA 126* 128* --  K 4.0 4.3 --  CL 92* 95* --  CO2 23 24 --  GLUCOSE 87 125* --  BUN 9 12 --  CREATININE 0.86 0.95 --  CALCIUM 9.4 8.7 --  MG -- -- 1.8  PHOS -- -- --   No results found for this basename: AST:2,ALT:2,ALKPHOS:2,BILITOT:2,PROT:2,ALBUMIN:2 in the last 72 hours No results found for this basename: LIPASE:2,AMYLASE:2 in the last 72 hours  Basename 08/09/11 0546  WBC 7.7  NEUTROABS --  HGB 8.3*  HCT 26.2*  MCV 75.7*  PLT 334   No results found for this basename: CKTOTAL:3,CKMB:3,CKMBINDEX:3,TROPONINI:3 in the last 72 hours No components found with this basename: POCBNP:3 No results found for this basename: DDIMER:2 in the last 72 hours No results found for this basename: HGBA1C:2 in the last 72 hours No results found for this basename: CHOL:2,HDL:2,LDLCALC:2,TRIG:2,CHOLHDL:2,LDLDIRECT:2 in the last 72 hours No results found for this basename: TSH,T4TOTAL,FREET3,T3FREE,THYROIDAB in the last 72 hours No results found  for this basename: VITAMINB12:2,FOLATE:2,FERRITIN:2,TIBC:2,IRON:2,RETICCTPCT:2 in the last 72 hours  Micro Results: Recent Results (from the past 240 hour(s))  MRSA PCR SCREENING     Status: Abnormal   Collection Time   08/04/11  2:17 AM      Component Value Range Status Comment   MRSA by PCR POSITIVE (*) NEGATIVE  Final     Studies/Results: No results found.  Medications: I have reviewed the patient's current medications.   Patient Active Hospital Problem List: GI bleed (08/03/2011) Resolved and GI has signed off. Etiology uncertain at this juncture. Patient will require follow up as outpatient.   Anemia (08/03/2011) Currently on Iron supplement. Needs CBC by primary in 6 weeks unless he has recurrence in bleeding.   Hyponatremia (08/03/2011) Currently being managed by Nephro. Pt is currently stable. Will consider adding salt tablet to current regimen.  Hypokalemia: Currently being orally replaced and resolved. May have been secondary to lasix administration.   Diabetes mellitus, type 2 (08/03/2011) Stable currently, will continue current regimen.   HTN (hypertension) (08/03/2011)  Will continue current regimen and monitor.   Dementia (08/03/2011) Stable and at baseline.   Disposition:  To ALF tomorrow.   LOS: 7 days   Penny Pia M.D.  [facility] 08/10/2011, 6:35 PM

## 2011-08-11 ENCOUNTER — Encounter (HOSPITAL_COMMUNITY): Payer: Self-pay | Admitting: Internal Medicine

## 2011-08-11 DIAGNOSIS — F0280 Dementia in other diseases classified elsewhere without behavioral disturbance: Secondary | ICD-10-CM

## 2011-08-11 DIAGNOSIS — R112 Nausea with vomiting, unspecified: Secondary | ICD-10-CM

## 2011-08-11 DIAGNOSIS — G309 Alzheimer's disease, unspecified: Secondary | ICD-10-CM

## 2011-08-11 DIAGNOSIS — K922 Gastrointestinal hemorrhage, unspecified: Secondary | ICD-10-CM

## 2011-08-11 DIAGNOSIS — E871 Hypo-osmolality and hyponatremia: Secondary | ICD-10-CM

## 2011-08-11 LAB — BASIC METABOLIC PANEL
BUN: 11 mg/dL (ref 6–23)
CO2: 25 mEq/L (ref 19–32)
Calcium: 9.4 mg/dL (ref 8.4–10.5)
GFR calc non Af Amer: 73 mL/min — ABNORMAL LOW (ref >90)
Glucose, Bld: 86 mg/dL (ref 70–99)
Potassium: 4.1 mEq/L (ref 3.5–5.1)
Sodium: 129 mEq/L — ABNORMAL LOW (ref 135–145)

## 2011-08-11 LAB — GLUCOSE, CAPILLARY: Glucose-Capillary: 82 mg/dL (ref 70–99)

## 2011-08-11 LAB — CBC
HCT: 26.7 % — ABNORMAL LOW (ref 39.0–52.0)
Hemoglobin: 8.5 g/dL — ABNORMAL LOW (ref 13.0–17.0)
MCH: 24.1 pg — ABNORMAL LOW (ref 26.0–34.0)
MCHC: 31.8 g/dL (ref 30.0–36.0)
RBC: 3.52 MIL/uL — ABNORMAL LOW (ref 4.22–5.81)

## 2011-08-11 MED ORDER — FUROSEMIDE 20 MG PO TABS: 20.0000 mg | ORAL_TABLET | Freq: Every day | ORAL | Status: DC

## 2011-08-11 MED ORDER — FERROUS SULFATE 325 (65 FE) MG PO TABS: 325.0000 mg | ORAL_TABLET | Freq: Three times a day (TID) | ORAL | Status: DC

## 2011-08-11 MED ORDER — HYDRALAZINE HCL 25 MG PO TABS: 25.0000 mg | ORAL_TABLET | Freq: Two times a day (BID) | ORAL | Status: DC

## 2011-08-11 MED ORDER — BUPROPION HCL ER (SR) 100 MG PO TB12: 100.0000 mg | ORAL_TABLET | Freq: Every morning | ORAL | Status: DC

## 2011-08-11 NOTE — Discharge Summary (Signed)
Admit date: 08/03/2011 Discharge date: 08/11/2011  Primary Care Physician:  Florentina Jenny, MD, MD   Discharge Diagnoses:   Active Hospital Problems  Diagnoses Date Noted   . GI bleed 08/03/2011   . Anemia 08/03/2011   . Hyponatremia 08/03/2011   . Diabetes mellitus, type 2 08/03/2011   . HTN (hypertension) 08/03/2011   . Dementia 08/03/2011     Resolved Hospital Problems  Diagnoses Date Noted Date Resolved  . Hypokalemia 08/09/2011 08/09/2011     DISCHARGE MEDICATION: Medication List  As of 08/11/2011  9:08 AM   STOP taking these medications         lisinopril-hydrochlorothiazide 20-12.5 MG per tablet      terazosin 1 MG capsule         TAKE these medications         acetaminophen 500 MG tablet   Commonly known as: TYLENOL   Take 500 mg by mouth 3 (three) times daily.      aspirin EC 81 MG tablet   Take 81 mg by mouth every morning.      buPROPion 100 MG 12 hr tablet   Commonly known as: WELLBUTRIN SR   Take 1 tablet (100 mg total) by mouth every morning.      cholecalciferol 1000 UNITS tablet   Commonly known as: VITAMIN D   Take 1,000 Units by mouth daily.      ferrous sulfate 325 (65 FE) MG tablet   Take 1 tablet (325 mg total) by mouth 3 (three) times daily with meals.      finasteride 5 MG tablet   Commonly known as: PROSCAR   Take 5 mg by mouth daily. *Do not crush.*      fluticasone 50 MCG/ACT nasal spray   Commonly known as: FLONASE   Place 1 spray into the nose every morning.      furosemide 20 MG tablet   Commonly known as: LASIX   Take 1 tablet (20 mg total) by mouth daily.      glyBURIDE 5 MG tablet   Commonly known as: DIABETA   Take 5 mg by mouth daily with breakfast.      hydrALAZINE 25 MG tablet   Commonly known as: APRESOLINE   Take 1 tablet (25 mg total) by mouth 2 (two) times daily.      hydrocerin Crea   Apply 1 application topically 2 (two) times daily. To legs and feet. (May keep at bedside.)      mulitivitamin with minerals  Tabs   Take 1 tablet by mouth every morning.      omeprazole 40 MG capsule   Commonly known as: PRILOSEC   Take 40 mg by mouth every morning.              Consults:     SIGNIFICANT DIAGNOSTIC STUDIES:  Dg Chest 2 View  08/04/2011  *RADIOLOGY REPORT*  Clinical Data: Prostate cancer, cough, congestion.  Smoker.  CHEST - 2 VIEW  Comparison: 08/04/2011  Findings: There are small bilateral pleural effusions.  Bibasilar atelectasis or scarring noted.  Heart is normal size.  Mild interstitial prominence has improved.  IMPRESSION: Small bilateral pleural effusions.  Bibasilar opacities.  I favor this represents atelectasis or scarring.  Original Report Authenticated By: Cyndie Chime, M.D.   Dg Chest Port 1 View  08/05/2011  *RADIOLOGY REPORT*  Clinical Data: Cough, hypertension  PORTABLE CHEST - 1 VIEW  Comparison: 08/04/2011  Findings: Cardiomediastinal silhouette is stable.  There is poor inspiration.  No convincing pulmonary edema.  Persistent small bilateral pleural effusion with streaky bilateral basilar atelectasis or infiltrate.  IMPRESSION: Poor inspiration.  Small bilateral pleural effusion with streaky bilateral basilar atelectasis or infiltrate.  No pulmonary edema.  Original Report Authenticated By: Natasha Mead, M.D.   Dg Chest Port 1 View  08/04/2011  *RADIOLOGY REPORT*  Clinical Data: Hypertension  PORTABLE CHEST - 1 VIEW  Comparison: None.  Findings: Hyperinflation with coarse interstitial markings. Cardiomegaly.  Aortic arch atherosclerosis.  Mild opacity along the left heart border.  No pleural effusion or pneumothorax.  No acute osseous abnormality.  IMPRESSION: Interstitial prominence and mild opacity along the left heart border; atelectasis versus infiltrate. Recommend PA and lateral radiographs when the patient can tolerate  for better characterization.  Original Report Authenticated By: Waneta Martins, M.D.     CARDIAC CATH & OTHER PROCEDURES: While in house patient had  upper endoscopy and colonoscopy 2ary to GI bleed and anemia.  Both studies were found to be negative.   Recent Results (from the past 240 hour(s))  MRSA PCR SCREENING     Status: Abnormal   Collection Time   08/04/11  2:17 AM      Component Value Range Status Comment   MRSA by PCR POSITIVE (*) NEGATIVE  Final     BRIEF ADMITTING H & P: Patient is an 78 y/o AAM with history of iron deficiency anemia and dementia.  He was admitted secondary to altered mental status, hyponatremia, anemia, and BRBPR.  Through the course of his stay patient had an evaluation from a GI specialist which included an upper endoscopy and colonoscopy which were negative to identify the source of bleeding.  GI recommended follow up with GI specialist and consideration of a SBCE for further evaluation.  His GI bleed ceased spontaneously and patient was placed on iron supplementation.  Hyponatremia:  Was contributing to his altered mental status.  Nephrology was consulted.  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  This was eventually discontinued and patient was placed on water restriction and lasix.  Also his lisinopril-hctz was discontinued which was felt to contribute to his hyponatremia.  I also reduced his wellbutrin.  Patient will require lasix daily and continue fluid restriction to ~ 33 ounces per day ( per day)  HTN: Lisinopril-HCTZ d/c'd due to suspected cause of worsening hyponatremia.  Thus we placed patient on hydralazine which he tolerated well  Anemia:  Patient initially required transfusion.  Once bleeding subsided we treated with ferrous sulfate orally TID with meals.  DM:  Will continue home regimen.  Dementia: Stable at this juncture.  BPH:  Discontinue terazosin.  Patient is currently on finasteride.    Active Hospital Problems  Diagnoses Date Noted   . GI bleed 08/03/2011   . Anemia 08/03/2011   . Hyponatremia 08/03/2011   . Diabetes  mellitus, type 2 08/03/2011   . HTN (hypertension) 08/03/2011   . Dementia 08/03/2011     Resolved Hospital Problems  Diagnoses Date Noted Date Resolved  . Hypokalemia 08/09/2011 08/09/2011     Disposition and Follow-up: Patient will require fluid restriction to ~ 33 ounces per day or 4 (eight ounce drinks per day) Discharge Orders    Future Orders Please Complete By Expires   Diet - low sodium heart healthy      Increase activity slowly      Discharge instructions      Comments:   Please follow  up with your primary care physician at the ALF facility.  Stop taking lisinopril-hctz.  Take instead hydralazine for blood pressure control.  Also take furosemide daily.  You will need to get your cbc rechecked in ~ 4-6 weeks or sooner should there be any more bleeding.  Also make an appointment to see a GI specialist for further evaluation given recent cause for admission and negative colonoscopy/endoscopy results.   Call MD for:  persistant nausea and vomiting      Call MD for:  extreme fatigue      Call MD for:  persistant dizziness or light-headedness           DISCHARGE EXAM:  General: Alert, awake, oriented x3, in no acute distress. HEENT: atraumatic, normocephalic, EOMI, PERRLA, No bruits, no goiter. Poor dental hygiene  Heart: Regular rate and rhythm, without murmurs, rubs, gallops. Lungs: Clear to auscultation bilaterally. Abdomen: Soft, nontender, nondistended, positive bowel sounds. Extremities: No clubbing cyanosis or edema with positive pedal pulses. Neuro: patient responds to questions appropriately.  No focal findings. Skin: no diaphoresis, obvious rashes    Blood pressure 121/75, pulse 75, temperature 98.6 F (37 C), temperature source Oral, resp. rate 18, height 5\' 3"  (1.6 m), weight 71.079 kg (156 lb 11.2 oz), SpO2 98.00%.   Basename 08/11/11 0452 08/10/11 1952  NA 129* 126*  K 4.1 4.1  CL 94* 91*  CO2 25 23  GLUCOSE 86 119*  BUN 11 13  CREATININE 0.96 1.05    CALCIUM 9.4 8.8  MG -- --  PHOS -- --   No results found for this basename: AST:2,ALT:2,ALKPHOS:2,BILITOT:2,PROT:2,ALBUMIN:2 in the last 72 hours No results found for this basename: LIPASE:2,AMYLASE:2 in the last 72 hours  Basename 08/11/11 0452 08/09/11 0546  WBC 8.7 7.7  NEUTROABS -- --  HGB 8.5* 8.3*  HCT 26.7* 26.2*  MCV 75.9* 75.7*  PLT 335 334    Signed: Penny Pia M.D. 08/11/2011, 9:08 AM

## 2011-08-11 NOTE — Progress Notes (Signed)
Patient discharge to SNF, alert and oriented X 2, discharge package given to the [facility], patient in stable condition at this time

## 2011-08-11 NOTE — Progress Notes (Signed)
Patient cleared for discharge to [facility]. Packet copied and placed in [place]. ptar called for transportation.  Ragna Kramlich C. Nichele Slawson MSW, LCSW [phone]

## 2011-09-05 ENCOUNTER — Telehealth: Payer: Self-pay | Admitting: *Deleted

## 2011-09-05 ENCOUNTER — Encounter: Payer: Self-pay | Admitting: *Deleted

## 2011-09-05 NOTE — Telephone Encounter (Signed)
I have spoken to [facility] at [facility] and have advised her that patient must have his power of attorney present with him at his office visit on 09/24/11 @ 9:45 am for [place] to see him. She verbalizes understanding and states that she will have his power of attorney come with him.

## 2011-09-05 NOTE — Telephone Encounter (Signed)
I have called to speak to [place], patient's caregiver @ [facility]. She was unable to talk at the time since she was doing a "medicine pass" and stated that she will call back. She needs to be advised that patient must have his power of attorney present at the time of his office visit.

## 2011-09-16 ENCOUNTER — Ambulatory Visit: Payer: Medicare Other | Admitting: Internal Medicine

## 2011-09-24 ENCOUNTER — Other Ambulatory Visit (INDEPENDENT_AMBULATORY_CARE_PROVIDER_SITE_OTHER): Payer: Medicare Other

## 2011-09-24 ENCOUNTER — Encounter: Payer: Self-pay | Admitting: Internal Medicine

## 2011-09-24 ENCOUNTER — Ambulatory Visit (INDEPENDENT_AMBULATORY_CARE_PROVIDER_SITE_OTHER): Payer: Medicare Other | Admitting: Internal Medicine

## 2011-09-24 VITALS — BP 140/68 | HR 64 | Ht 67.0 in | Wt 146.4 lb

## 2011-09-24 DIAGNOSIS — E538 Deficiency of other specified B group vitamins: Secondary | ICD-10-CM

## 2011-09-24 DIAGNOSIS — D649 Anemia, unspecified: Secondary | ICD-10-CM

## 2011-09-24 DIAGNOSIS — R195 Other fecal abnormalities: Secondary | ICD-10-CM

## 2011-09-24 LAB — CBC WITH DIFFERENTIAL/PLATELET
Basophils Absolute: 0 K/uL (ref 0.0–0.1)
Basophils Relative: 0.1 % (ref 0.0–3.0)
Eosinophils Absolute: 0.2 K/uL (ref 0.0–0.7)
Eosinophils Relative: 2.3 % (ref 0.0–5.0)
HCT: 37 % — ABNORMAL LOW (ref 39.0–52.0)
Hemoglobin: 12.1 g/dL — ABNORMAL LOW (ref 13.0–17.0)
Lymphocytes Relative: 13 % (ref 12.0–46.0)
Lymphs Abs: 1 K/uL (ref 0.7–4.0)
MCHC: 32.8 g/dL (ref 30.0–36.0)
MCV: 92.8 fl (ref 78.0–100.0)
Monocytes Absolute: 0.7 K/uL (ref 0.1–1.0)
Monocytes Relative: 8.2 % (ref 3.0–12.0)
Neutro Abs: 6.1 K/uL (ref 1.4–7.7)
Neutrophils Relative %: 76.4 % (ref 43.0–77.0)
Platelets: 350 K/uL (ref 150.0–400.0)
RBC: 3.98 Mil/uL — ABNORMAL LOW (ref 4.22–5.81)
RDW: 27.4 % — ABNORMAL HIGH (ref 11.5–14.6)
WBC: 8 K/uL (ref 4.5–10.5)

## 2011-09-24 LAB — FERRITIN: Ferritin: 40.4 ng/mL (ref 22.0–322.0)

## 2011-09-24 LAB — IBC PANEL
Iron: 42 ug/dL (ref 42–165)
Saturation Ratios: 11.9 % — ABNORMAL LOW (ref 20.0–50.0)
Transferrin: 252.7 mg/dL (ref 212.0–360.0)

## 2011-09-24 NOTE — Patient Instructions (Addendum)
You will have labs done today and again in 6 weeks.  Please go to our basement level to have this done.   You do not need an appointment to have the labs repeated in 6 weeks.  The lab is open from 730 am to 5 pm Monday thru Friday.  CC:  Florentina Jenny MD

## 2011-09-24 NOTE — Progress Notes (Signed)
Michael Yang January 06, 1924 MRN [id number]   History of Present Illness:  This is an 78 year old African American male who is post hospitalization for a GI bleed. He presented with a hemoglobin of 6.3 g. His GI workup in the hospital was negative. An upper endoscopy showed a normal esophagus, stomach and duodenum. His colonoscopy in May 2013 was also normal. He denies abdominal pain, change in bowel habits, nausea, vomiting or dysphagia. He has been on iron supplements. His last hemoglobin on May 6 was 8.5 with a hematocrit of 26.7 and MCV of 75.    Past Medical History  Diagnosis Date  . Hypertension   . Prostate cancer   . Angiospasm   . Diabetes mellitus   . Schizoaffective disorder   . Dementia   . Cataracts, bilateral   . Blindness   . Rhabdomyolysis   . Gastritis   . UTI (lower urinary tract infection)   . GI bleed   . Anemia    Past Surgical History  Procedure Date  . Colonoscopy 08/08/2011    Procedure: COLONOSCOPY;  Surgeon: Hart Carwin, MD;  Location: [facility];  Service: Endoscopy;  Laterality: N/A;  Rm 1240  . Esophagogastroduodenoscopy 08/08/2011    Procedure: ESOPHAGOGASTRODUODENOSCOPY (EGD);  Surgeon: Hart Carwin, MD;  Location: [facility];  Service: Endoscopy;  Laterality: N/A;  . Abdominal surgery     patient does not remember what kind of surgery or why he had surgery    reports that he has been smoking Cigarettes.  He has smoked for the past 70 years. He has quit using smokeless tobacco. He reports that he does not drink alcohol or use illicit drugs. family history is not on file. Allergies  Allergen Reactions  . Hydrochlorothiazide     Hyponatremia per Nephrology        Review of Systems: Negative for dysphagia odynophagia nausea vomiting abdominal pain  The remainder of the 10 point ROS is negative except as outlined in H&P   Physical Exam: General appearance  Well developed, in no distress. Eyes- non icteric. HEENT nontraumatic,  normocephalic. Mouth no lesions, tongue papillated, no cheilosis. Neck supple without adenopathy, thyroid not enlarged, no carotid bruits, no JVD. Lungs Clear to auscultation bilaterally. Cor normal S1, normal S2, regular rhythm, no murmur,  quiet precordium. Abdomen: Soft nontender abdomen with normal active bowel sounds. No tenderness. No mass. Rectal: Large amount of soft Hemoccult positive stool. Extremities no pedal edema. Skin no lesions. Neurological alert and oriented x 3. Psychological normal mood and affect.  Assessment and Plan:  Problem #32 78 year old [place] American male with continued heme positive stool but negative GI workup after an upper endoscopy and colonoscopy. We will recheck his blood count as well as iron studies today. If he is unable to maintain his hemoglobin, I would consider a small bowel capsule endoscopy or CT scan of the abdomen. We will repeat his blood count in 6 weeks.He may need Iron infusion.  Problem#2 organic brain syndrome- stable   09/24/2011 [name]

## 2011-11-05 ENCOUNTER — Telehealth: Payer: Self-pay | Admitting: *Deleted

## 2011-11-05 NOTE — Telephone Encounter (Signed)
Message copied by Richardson Chiquito on Wed Nov 05, 2011  8:13 AM ------      Message from: Donata Duff      Created: Wed Sep 24, 2011 10:50 AM       This pt is to get labs see 09/24/11 office note

## 2011-11-05 NOTE — Telephone Encounter (Signed)
Phone number provided in the system has been disconnected. I will send a letter to patient.

## 2013-07-04 ENCOUNTER — Emergency Department (HOSPITAL_COMMUNITY)
Admission: EM | Admit: 2013-07-04 | Discharge: 2013-07-04 | Disposition: A | Discharge status: Home / Self Care | Payer: Medicare Other | Attending: Emergency Medicine | Admitting: Emergency Medicine

## 2013-07-04 ENCOUNTER — Encounter (HOSPITAL_COMMUNITY): Payer: Self-pay | Admitting: Emergency Medicine

## 2013-07-04 DIAGNOSIS — F0390 Unspecified dementia without behavioral disturbance: Secondary | ICD-10-CM | POA: Insufficient documentation

## 2013-07-04 DIAGNOSIS — H9209 Otalgia, unspecified ear: Secondary | ICD-10-CM | POA: Insufficient documentation

## 2013-07-04 DIAGNOSIS — Z8739 Personal history of other diseases of the musculoskeletal system and connective tissue: Secondary | ICD-10-CM | POA: Insufficient documentation

## 2013-07-04 DIAGNOSIS — K2970 Gastritis, unspecified, without bleeding: Secondary | ICD-10-CM | POA: Insufficient documentation

## 2013-07-04 DIAGNOSIS — IMO0002 Reserved for concepts with insufficient information to code with codable children: Secondary | ICD-10-CM | POA: Insufficient documentation

## 2013-07-04 DIAGNOSIS — Z7982 Long term (current) use of aspirin: Secondary | ICD-10-CM | POA: Insufficient documentation

## 2013-07-04 DIAGNOSIS — F259 Schizoaffective disorder, unspecified: Secondary | ICD-10-CM | POA: Insufficient documentation

## 2013-07-04 DIAGNOSIS — F17200 Nicotine dependence, unspecified, uncomplicated: Secondary | ICD-10-CM | POA: Insufficient documentation

## 2013-07-04 DIAGNOSIS — Z87440 Personal history of urinary (tract) infections: Secondary | ICD-10-CM | POA: Insufficient documentation

## 2013-07-04 DIAGNOSIS — E119 Type 2 diabetes mellitus without complications: Secondary | ICD-10-CM | POA: Insufficient documentation

## 2013-07-04 DIAGNOSIS — Z8546 Personal history of malignant neoplasm of prostate: Secondary | ICD-10-CM | POA: Insufficient documentation

## 2013-07-04 DIAGNOSIS — I10 Essential (primary) hypertension: Secondary | ICD-10-CM | POA: Insufficient documentation

## 2013-07-04 DIAGNOSIS — K2990 Gastroduodenitis, unspecified, without bleeding: Secondary | ICD-10-CM

## 2013-07-04 DIAGNOSIS — D649 Anemia, unspecified: Secondary | ICD-10-CM | POA: Insufficient documentation

## 2013-07-04 DIAGNOSIS — H9210 Otorrhea, unspecified ear: Secondary | ICD-10-CM | POA: Insufficient documentation

## 2013-07-04 DIAGNOSIS — Z79899 Other long term (current) drug therapy: Secondary | ICD-10-CM | POA: Insufficient documentation

## 2013-07-04 MED ORDER — CIPROFLOXACIN-HYDROCORTISONE 0.2-1 % OT SUSP: 3.0000 [drp] | Freq: Two times a day (BID) | OTIC | Status: DC

## 2013-07-04 MED ORDER — MUPIROCIN CALCIUM 2 % EX CREA: 1.0000 "application " | TOPICAL_CREAM | Freq: Two times a day (BID) | CUTANEOUS | Status: DC

## 2013-07-04 NOTE — ED Provider Notes (Signed)
CSN: [id number]     Arrival date & time 07/04/13  1140 History   First MD Initiated Contact with Patient 07/04/13 1211     Chief Complaint  Patient presents with  . follow up ear infection      (Consider location/radiation/quality/duration/timing/severity/associated sxs/prior Treatment) HPI: Michael Yang is an 80 year old man who presents to the Emergency Department for follow up on an ear infection.  He states that he finished his antibiotics without adverse events.  He denies inner ear pain and ear discharge.  He does note that his outer ear is painful and itchy.  Past Medical History  Diagnosis Date  . Hypertension   . Prostate cancer   . Angiospasm   . Diabetes mellitus   . Schizoaffective disorder   . Dementia   . Cataracts, bilateral   . Blindness   . Rhabdomyolysis   . Gastritis   . UTI (lower urinary tract infection)   . GI bleed   . Anemia    Past Surgical History  Procedure Laterality Date  . Colonoscopy  08/08/2011    Procedure: COLONOSCOPY;  Surgeon: Lafayette Dragon, MD;  Location: [facility];  Service: Endoscopy;  Laterality: N/A;  Rm 1240  . Esophagogastroduodenoscopy  08/08/2011    Procedure: ESOPHAGOGASTRODUODENOSCOPY (EGD);  Surgeon: Lafayette Dragon, MD;  Location: [facility];  Service: Endoscopy;  Laterality: N/A;  . Abdominal surgery      patient does not remember what kind of surgery or why he had surgery   No family history on file. History  Substance Use Topics  . Smoking status: Current Every Day Smoker -- 70 years    Types: Cigarettes  . Smokeless tobacco: Former Systems developer  . Alcohol Use: No    Review of Systems  All other systems negative except as documented in the HPI. All pertinent positives and negatives as reviewed in the HPI.  Allergies  Hydrochlorothiazide  Home Medications   Current Outpatient Rx  Name  Route  Sig  Dispense  Refill  . acetaminophen (TYLENOL) 500 MG tablet   Oral   Take 500 mg by mouth 3 (three) times daily.          Marland Kitchen aspirin EC 81 MG tablet   Oral   Take 81 mg by mouth every morning.         Marland Kitchen EXPIRED: buPROPion (WELLBUTRIN SR) 100 MG 12 hr tablet   Oral   Take 1 tablet (100 mg total) by mouth every morning.   30 tablet   0   . cholecalciferol (VITAMIN D) 1000 UNITS tablet   Oral   Take 1,000 Units by mouth daily.         Marland Kitchen EXPIRED: ferrous sulfate 325 (65 FE) MG tablet   Oral   Take 1 tablet (325 mg total) by mouth 3 (three) times daily with meals.   180 tablet   0   . finasteride (PROSCAR) 5 MG tablet   Oral   Take 5 mg by mouth daily. *Do not crush.*         . fluticasone (FLONASE) 50 MCG/ACT nasal spray   Nasal   Place 1 spray into the nose every morning.         Marland Kitchen EXPIRED: furosemide (LASIX) 20 MG tablet   Oral   Take 1 tablet (20 mg total) by mouth daily.   30 tablet   0   . glyBURIDE (DIABETA) 5 MG tablet   Oral   Take 5 mg by  mouth daily with breakfast.         . EXPIRED: hydrALAZINE (APRESOLINE) 25 MG tablet   Oral   Take 1 tablet (25 mg total) by mouth 2 (two) times daily.   60 tablet   0   . hydrocerin (EUCERIN) CREA   Topical   Apply 1 application topically 2 (two) times daily. To legs and feet. (May keep at bedside.)         . Multiple Vitamin (MULITIVITAMIN WITH MINERALS) TABS   Oral   Take 1 tablet by mouth every morning.         Marland Kitchen omeprazole (PRILOSEC) 40 MG capsule   Oral   Take 40 mg by mouth every morning.          BP 184/83  Pulse 90  Temp(Src) 97.5 F (36.4 C)  Resp 16  SpO2 100% Physical Exam  Nursing note and vitals reviewed. Constitutional: He appears well-developed and well-nourished. No distress.  HENT:  Head: Normocephalic and atraumatic.  Left ear has whitish discharge built up in the lower 2/3 of the ear canal, tympanic membrane intact without edema or fluid; right external ear has crusting over excoriations on the intertragical notch, tympanic membrane intact without edema or fluid  Eyes: Pupils are equal,  round, and reactive to light.  Neurological: He is alert.  Skin: Skin is warm and dry.    ED Course  Procedures (including critical care time) Labs Review Labs Reviewed - No data to display  Patient be referred back to his primary care Dr. as well as ENT as needed.  Patient is advised to return here for any worsening in his condition.  Patient be given Bactroban ointment to use on the outer part of his ear.Michael Miner Sahib Pella, PA-C 07/04/13 1401

## 2013-07-04 NOTE — ED Provider Notes (Signed)
Medical screening examination/treatment/procedure(s) were conducted as a shared visit with non-physician practitioner(s) and myself.  I personally evaluated the patient during the encounter.   EKG Interpretation None      Pt states here for f/u ear infection. No tinnitus or hearing loss. No neck pain or stiffness/rigidity on exam. Afeb.   Mirna Mires, MD 07/04/13 [phone]

## 2013-07-04 NOTE — Discharge Instructions (Signed)
Return here as needed.  Followup with your primary care Dr. as well as the ear, nose, and throat specialist, as needed

## 2013-07-04 NOTE — ED Notes (Signed)
Per EMS-patient has b/l ear infection one week ago and treated for it-here for follow up/eval-no complaints from patient

## 2013-07-04 NOTE — ED Notes (Signed)
Bed: [facility] Expected date:  Expected time:  Means of arrival:  Comments: EMS-ear pain

## 2013-07-04 NOTE — ED Notes (Signed)
[facility] called for transport back to facility

## 2013-07-08 IMAGING — CR DG CHEST 1V PORT
1 series · 1 of 1 positions shown · non-contrast
Comparison: None.

CLINICAL DATA: Hypertension

PORTABLE CHEST - 1 VIEW

[AP]
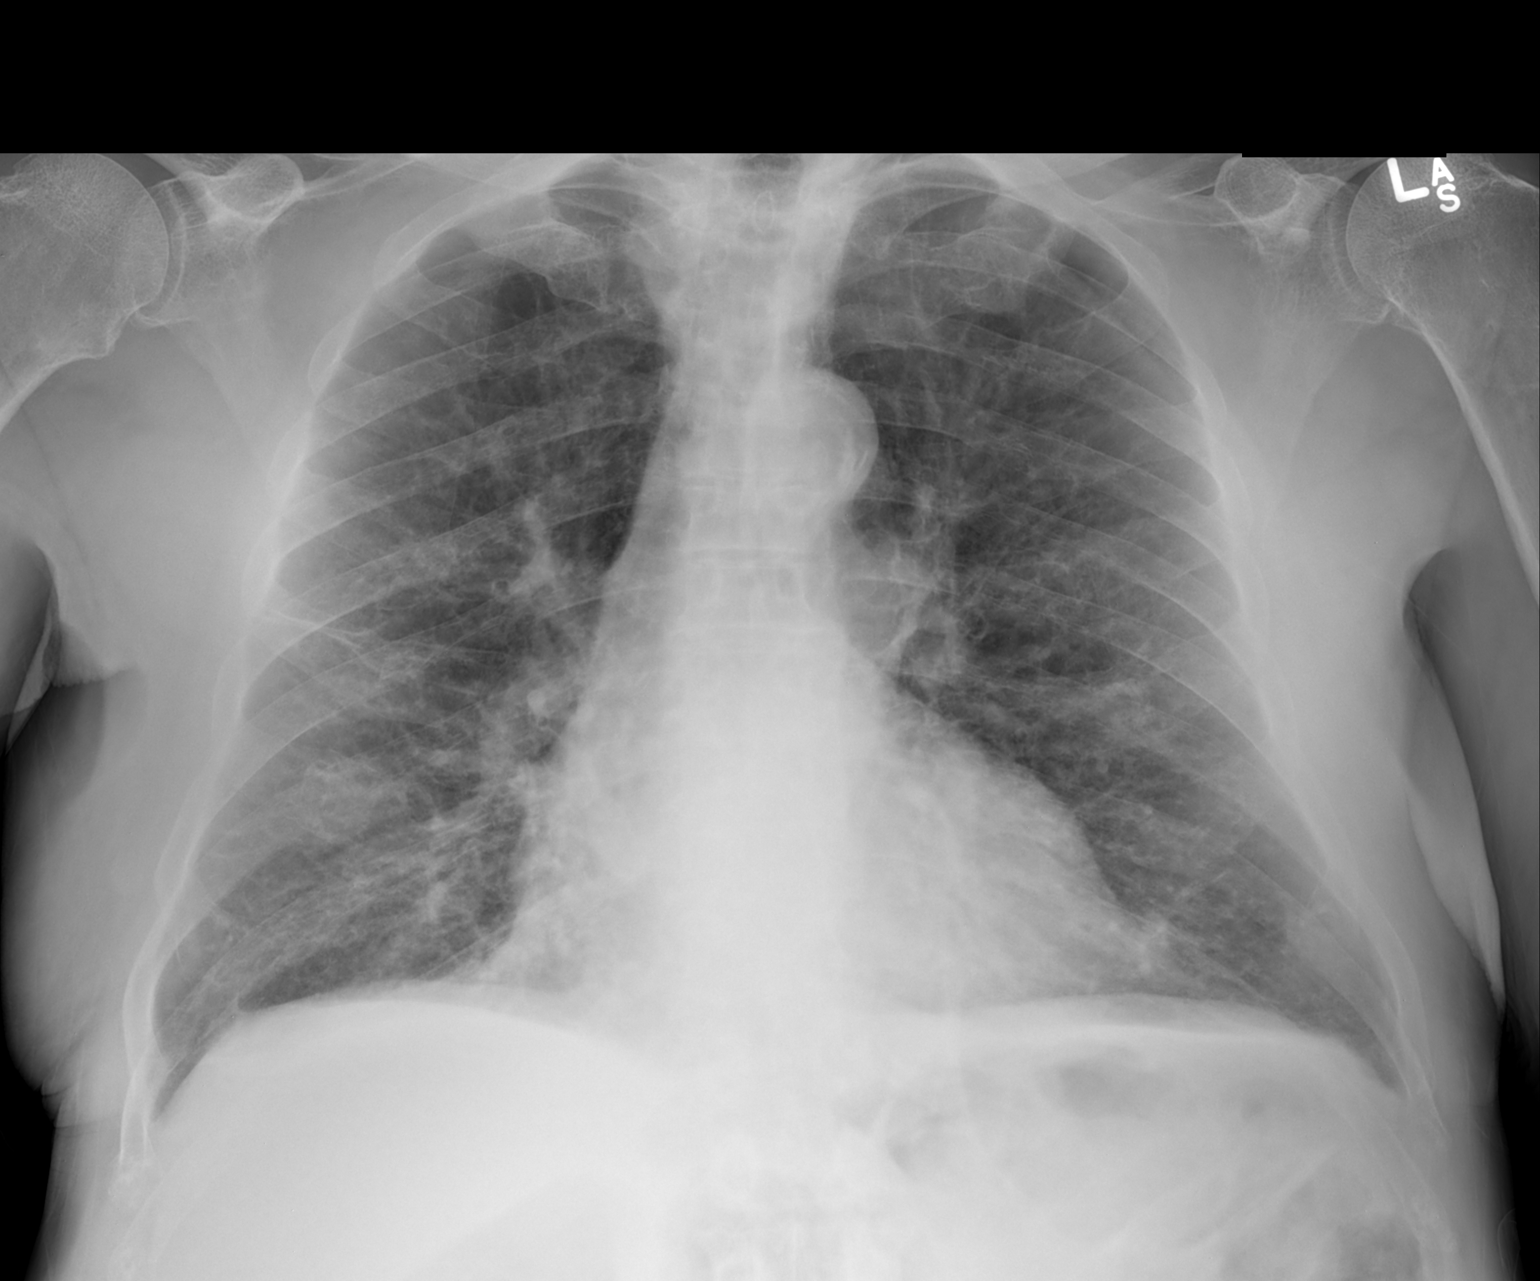

[1 of 1 positions shown; findings below may reference images not displayed]

FINDINGS: Hyperinflation with coarse interstitial markings.
Cardiomegaly.  Aortic arch atherosclerosis.  Mild opacity along the
left heart border.  No pleural effusion or pneumothorax.  No acute
osseous abnormality.
IMPRESSION: Interstitial prominence and mild opacity along the left heart
border; atelectasis versus infiltrate. Recommend PA and lateral
radiographs when the patient can tolerate  for better
characterization.

## 2013-07-09 IMAGING — CR DG CHEST 1V PORT
1 series · 1 of 1 positions shown · non-contrast
Comparison: 08/04/2011

CLINICAL DATA: Cough, hypertension

PORTABLE CHEST - 1 VIEW

[AP]
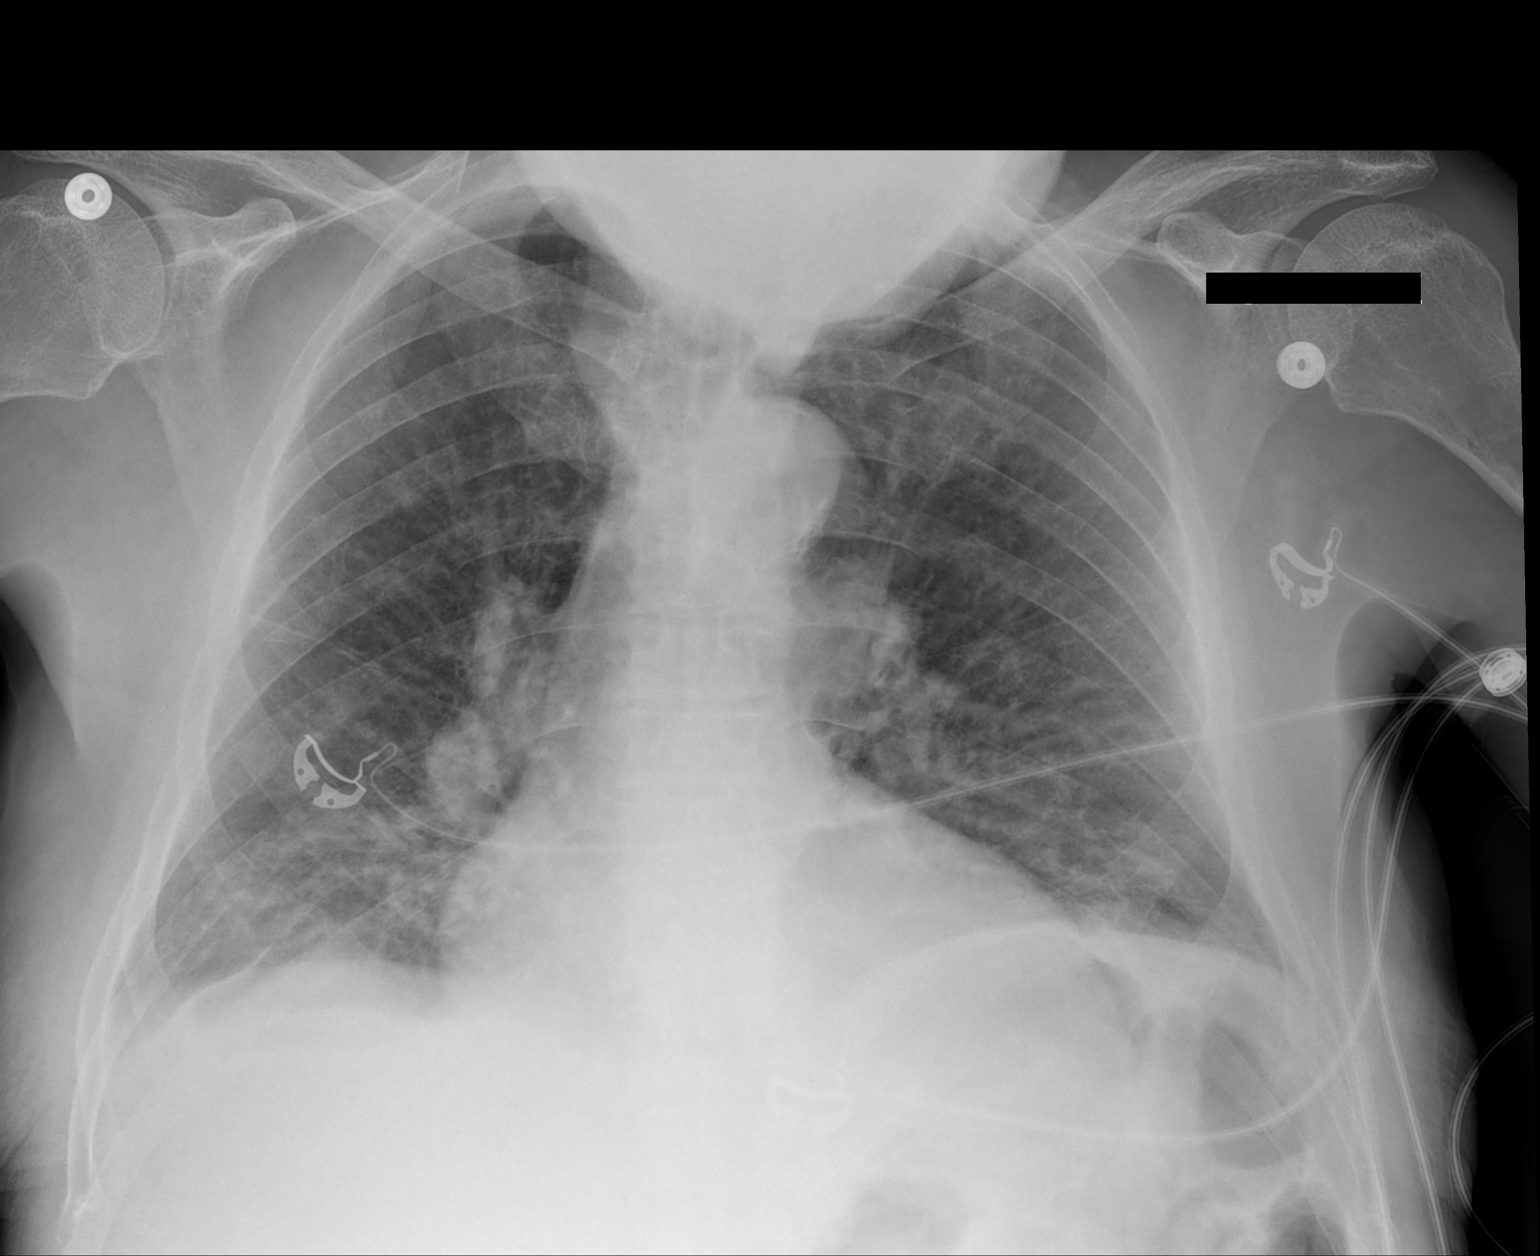

[1 of 1 positions shown; findings below may reference images not displayed]

FINDINGS: Cardiomediastinal silhouette is stable.  There is poor
inspiration.  No convincing pulmonary edema.  Persistent small
bilateral pleural effusion with streaky bilateral basilar
atelectasis or infiltrate.
IMPRESSION: Poor inspiration.  Small bilateral pleural effusion with streaky
bilateral basilar atelectasis or infiltrate.  No pulmonary edema.

## 2014-06-03 ENCOUNTER — Emergency Department (HOSPITAL_COMMUNITY): Payer: Medicare Other

## 2014-06-03 ENCOUNTER — Encounter (HOSPITAL_COMMUNITY): Payer: Self-pay | Admitting: *Deleted

## 2014-06-03 ENCOUNTER — Inpatient Hospital Stay (HOSPITAL_COMMUNITY)
Admission: EM | Admit: 2014-06-03 | Discharge: 2014-06-05 | DRG: 690 | Disposition: A | Discharge status: Skilled Nursing Facility | Payer: Medicare Other | Attending: Internal Medicine | Admitting: Internal Medicine

## 2014-06-03 DIAGNOSIS — I495 Sick sinus syndrome: Secondary | ICD-10-CM | POA: Insufficient documentation

## 2014-06-03 DIAGNOSIS — R404 Transient alteration of awareness: Secondary | ICD-10-CM

## 2014-06-03 DIAGNOSIS — E869 Volume depletion, unspecified: Secondary | ICD-10-CM | POA: Diagnosis present

## 2014-06-03 DIAGNOSIS — Z7982 Long term (current) use of aspirin: Secondary | ICD-10-CM | POA: Diagnosis not present

## 2014-06-03 DIAGNOSIS — E119 Type 2 diabetes mellitus without complications: Secondary | ICD-10-CM | POA: Diagnosis present

## 2014-06-03 DIAGNOSIS — D72829 Elevated white blood cell count, unspecified: Secondary | ICD-10-CM | POA: Insufficient documentation

## 2014-06-03 DIAGNOSIS — I10 Essential (primary) hypertension: Secondary | ICD-10-CM | POA: Diagnosis present

## 2014-06-03 DIAGNOSIS — R001 Bradycardia, unspecified: Secondary | ICD-10-CM | POA: Diagnosis present

## 2014-06-03 DIAGNOSIS — R55 Syncope and collapse: Secondary | ICD-10-CM | POA: Diagnosis present

## 2014-06-03 DIAGNOSIS — F0390 Unspecified dementia without behavioral disturbance: Secondary | ICD-10-CM | POA: Diagnosis present

## 2014-06-03 DIAGNOSIS — E876 Hypokalemia: Secondary | ICD-10-CM | POA: Diagnosis present

## 2014-06-03 DIAGNOSIS — Z66 Do not resuscitate: Secondary | ICD-10-CM | POA: Diagnosis present

## 2014-06-03 DIAGNOSIS — N390 Urinary tract infection, site not specified: Principal | ICD-10-CM | POA: Diagnosis present

## 2014-06-03 DIAGNOSIS — D649 Anemia, unspecified: Secondary | ICD-10-CM | POA: Diagnosis present

## 2014-06-03 DIAGNOSIS — I441 Atrioventricular block, second degree: Secondary | ICD-10-CM | POA: Diagnosis present

## 2014-06-03 DIAGNOSIS — D6859 Other primary thrombophilia: Secondary | ICD-10-CM | POA: Insufficient documentation

## 2014-06-03 DIAGNOSIS — F259 Schizoaffective disorder, unspecified: Secondary | ICD-10-CM | POA: Diagnosis present

## 2014-06-03 DIAGNOSIS — E875 Hyperkalemia: Secondary | ICD-10-CM | POA: Diagnosis present

## 2014-06-03 DIAGNOSIS — R4182 Altered mental status, unspecified: Secondary | ICD-10-CM | POA: Insufficient documentation

## 2014-06-03 DIAGNOSIS — F17210 Nicotine dependence, cigarettes, uncomplicated: Secondary | ICD-10-CM | POA: Diagnosis present

## 2014-06-03 DIAGNOSIS — E871 Hypo-osmolality and hyponatremia: Secondary | ICD-10-CM | POA: Diagnosis not present

## 2014-06-03 DIAGNOSIS — I4510 Unspecified right bundle-branch block: Secondary | ICD-10-CM | POA: Diagnosis present

## 2014-06-03 HISTORY — DX: Syncope and collapse: R55

## 2014-06-03 LAB — URINALYSIS, ROUTINE W REFLEX MICROSCOPIC
Bilirubin Urine: NEGATIVE
GLUCOSE, UA: NEGATIVE mg/dL
KETONES UR: NEGATIVE mg/dL
NITRITE: NEGATIVE
PH: 7.5 (ref 5.0–8.0)
Protein, ur: 100 mg/dL — AB
Specific Gravity, Urine: 1.014 (ref 1.005–1.030)
Urobilinogen, UA: 1 mg/dL (ref 0.0–1.0)

## 2014-06-03 LAB — CBC WITH DIFFERENTIAL/PLATELET
BASOS PCT: 0 % (ref 0–1)
Basophils Absolute: 0 10*3/uL (ref 0.0–0.1)
EOS ABS: 0.2 10*3/uL (ref 0.0–0.7)
Eosinophils Relative: 2 % (ref 0–5)
HEMATOCRIT: 32.1 % — AB (ref 39.0–52.0)
HEMOGLOBIN: 10.7 g/dL — AB (ref 13.0–17.0)
Lymphocytes Relative: 5 % — ABNORMAL LOW (ref 12–46)
Lymphs Abs: 0.5 10*3/uL — ABNORMAL LOW (ref 0.7–4.0)
MCH: 31.6 pg (ref 26.0–34.0)
MCHC: 33.3 g/dL (ref 30.0–36.0)
MCV: 94.7 fL (ref 78.0–100.0)
Monocytes Absolute: 0.9 10*3/uL (ref 0.1–1.0)
Monocytes Relative: 8 % (ref 3–12)
Neutro Abs: 9.3 10*3/uL — ABNORMAL HIGH (ref 1.7–7.7)
Neutrophils Relative %: 85 % — ABNORMAL HIGH (ref 43–77)
PLATELETS: 320 10*3/uL (ref 150–400)
RBC: 3.39 MIL/uL — ABNORMAL LOW (ref 4.22–5.81)
RDW: 14.6 % (ref 11.5–15.5)
WBC: 10.8 10*3/uL — ABNORMAL HIGH (ref 4.0–10.5)

## 2014-06-03 LAB — TSH: TSH: 0.611 u[IU]/mL (ref 0.350–4.500)

## 2014-06-03 LAB — COMPREHENSIVE METABOLIC PANEL
ALK PHOS: 74 U/L (ref 39–117)
ALT: 22 U/L (ref 0–53)
AST: 69 U/L — AB (ref 0–37)
Albumin: 3.2 g/dL — ABNORMAL LOW (ref 3.5–5.2)
Anion gap: 9 (ref 5–15)
BUN: 18 mg/dL (ref 6–23)
CALCIUM: 9.1 mg/dL (ref 8.4–10.5)
CO2: 23 mmol/L (ref 19–32)
Chloride: 99 mmol/L (ref 96–112)
Creatinine, Ser: 1.14 mg/dL (ref 0.50–1.35)
GFR calc Af Amer: 64 mL/min — ABNORMAL LOW (ref >90)
GFR calc non Af Amer: 55 mL/min — ABNORMAL LOW (ref >90)
Glucose, Bld: 112 mg/dL — ABNORMAL HIGH (ref 70–99)
Potassium: 5.7 mmol/L — ABNORMAL HIGH (ref 3.5–5.1)
Sodium: 131 mmol/L — ABNORMAL LOW (ref 135–145)
TOTAL PROTEIN: 6.4 g/dL (ref 6.0–8.3)
Total Bilirubin: 1 mg/dL (ref 0.3–1.2)

## 2014-06-03 LAB — I-STAT ARTERIAL BLOOD GAS, ED
Acid-base deficit: 1 mmol/L (ref 0.0–2.0)
BICARBONATE: 24.2 meq/L — AB (ref 20.0–24.0)
O2 SAT: 96 %
PCO2 ART: 38 mmHg (ref 35.0–45.0)
TCO2: 25 mmol/L (ref 0–100)
pH, Arterial: 7.408 (ref 7.350–7.450)
pO2, Arterial: 77 mmHg — ABNORMAL LOW (ref 80.0–100.0)

## 2014-06-03 LAB — MRSA PCR SCREENING: MRSA by PCR: POSITIVE — AB

## 2014-06-03 LAB — CREATININE, SERUM
Creatinine, Ser: 0.94 mg/dL (ref 0.50–1.35)
GFR calc Af Amer: 84 mL/min — ABNORMAL LOW (ref >90)
GFR calc non Af Amer: 72 mL/min — ABNORMAL LOW (ref >90)

## 2014-06-03 LAB — TROPONIN I: Troponin I: <0.03 ng/mL (ref <0.031)

## 2014-06-03 LAB — URINE MICROSCOPIC-ADD ON

## 2014-06-03 LAB — D-DIMER, QUANTITATIVE: D-Dimer, Quant: 0.31 ug/mL-FEU (ref 0.00–0.48)

## 2014-06-03 LAB — BRAIN NATRIURETIC PEPTIDE: B NATRIURETIC PEPTIDE 5: 41.6 pg/mL (ref 0.0–100.0)

## 2014-06-03 LAB — GLUCOSE, CAPILLARY: Glucose-Capillary: 115 mg/dL — ABNORMAL HIGH (ref 70–99)

## 2014-06-03 LAB — I-STAT TROPONIN, ED: Troponin i, poc: 0 ng/mL (ref 0.00–0.08)

## 2014-06-03 LAB — RAPID URINE DRUG SCREEN, HOSP PERFORMED
Amphetamines: NOT DETECTED
BARBITURATES: NOT DETECTED
Benzodiazepines: NOT DETECTED
Cocaine: NOT DETECTED
Opiates: NOT DETECTED
Tetrahydrocannabinol: NOT DETECTED

## 2014-06-03 LAB — PROTIME-INR
INR: 1.8 — AB (ref 0.00–1.49)
Prothrombin Time: 21.1 seconds — ABNORMAL HIGH (ref 11.6–15.2)

## 2014-06-03 LAB — ETHANOL: Alcohol, Ethyl (B): <5 mg/dL (ref 0–9)

## 2014-06-03 LAB — SALICYLATE LEVEL: Salicylate Lvl: <4 mg/dL (ref 2.8–20.0)

## 2014-06-03 LAB — MAGNESIUM: MAGNESIUM: 1.9 mg/dL (ref 1.5–2.5)

## 2014-06-03 LAB — CBG MONITORING, ED: Glucose-Capillary: 82 mg/dL (ref 70–99)

## 2014-06-03 MED ORDER — MUPIROCIN 2 % EX OINT
1.0000 "application " | TOPICAL_OINTMENT | Freq: Two times a day (BID) | CUTANEOUS | Status: DC
  Administered 2014-06-04 – 2014-06-05 (×3): 1 via NASAL
  Filled 2014-06-03: qty 22

## 2014-06-03 MED ORDER — BUPROPION HCL ER (SR) 100 MG PO TB12
100.0000 mg | ORAL_TABLET | Freq: Every day | ORAL | Status: DC
  Administered 2014-06-04 – 2014-06-05 (×2): 100 mg via ORAL
  Filled 2014-06-03 (×2): qty 1

## 2014-06-03 MED ORDER — DEXTROSE 50 % IV SOLN
1.0000 | Freq: Once | INTRAVENOUS | Status: AC
  Administered 2014-06-03: 50 mL via INTRAVENOUS
  Filled 2014-06-03: qty 50

## 2014-06-03 MED ORDER — FUROSEMIDE 20 MG PO TABS
20.0000 mg | ORAL_TABLET | Freq: Every day | ORAL | Status: DC
  Administered 2014-06-04 – 2014-06-05 (×2): 20 mg via ORAL
  Filled 2014-06-03 (×2): qty 1

## 2014-06-03 MED ORDER — ADULT MULTIVITAMIN W/MINERALS CH
1.0000 | ORAL_TABLET | Freq: Every morning | ORAL | Status: DC
  Administered 2014-06-04 – 2014-06-05 (×2): 1 via ORAL
  Filled 2014-06-03 (×2): qty 1

## 2014-06-03 MED ORDER — SODIUM POLYSTYRENE SULFONATE 15 GM/60ML PO SUSP
30.0000 g | Freq: Once | ORAL | Status: AC
  Administered 2014-06-03: 30 g via ORAL
  Filled 2014-06-03: qty 120

## 2014-06-03 MED ORDER — ASPIRIN EC 81 MG PO TBEC
81.0000 mg | DELAYED_RELEASE_TABLET | Freq: Every day | ORAL | Status: DC
  Administered 2014-06-03 – 2014-06-05 (×3): 81 mg via ORAL
  Filled 2014-06-03 (×3): qty 1

## 2014-06-03 MED ORDER — ONDANSETRON HCL 4 MG/2ML IJ SOLN: 4.0000 mg | Freq: Four times a day (QID) | INTRAMUSCULAR | Status: DC | PRN

## 2014-06-03 MED ORDER — HYDRALAZINE HCL 25 MG PO TABS
25.0000 mg | ORAL_TABLET | Freq: Three times a day (TID) | ORAL | Status: DC
  Administered 2014-06-03 – 2014-06-05 (×5): 25 mg via ORAL
  Filled 2014-06-03 (×7): qty 1

## 2014-06-03 MED ORDER — CEFTRIAXONE SODIUM 1 G IJ SOLR
1.0000 g | Freq: Once | INTRAMUSCULAR | Status: AC
  Administered 2014-06-03: 1 g via INTRAVENOUS
  Filled 2014-06-03: qty 10

## 2014-06-03 MED ORDER — CHLORHEXIDINE GLUCONATE CLOTH 2 % EX PADS
6.0000 | MEDICATED_PAD | Freq: Every day | CUTANEOUS | Status: DC
  Administered 2014-06-04 – 2014-06-05 (×2): 6 via TOPICAL

## 2014-06-03 MED ORDER — SODIUM CHLORIDE 0.9 % IJ SOLN
3.0000 mL | Freq: Two times a day (BID) | INTRAMUSCULAR | Status: DC
  Administered 2014-06-04 (×2): 3 mL via INTRAVENOUS

## 2014-06-03 MED ORDER — SODIUM CHLORIDE 0.9 % IV SOLN: 250.0000 mL | INTRAVENOUS | Status: DC | PRN

## 2014-06-03 MED ORDER — PANTOPRAZOLE SODIUM 40 MG PO TBEC
40.0000 mg | DELAYED_RELEASE_TABLET | Freq: Every day | ORAL | Status: DC
  Administered 2014-06-04 – 2014-06-05 (×2): 40 mg via ORAL
  Filled 2014-06-03 (×2): qty 1

## 2014-06-03 MED ORDER — FLUTICASONE PROPIONATE 50 MCG/ACT NA SUSP
1.0000 | Freq: Every day | NASAL | Status: DC
  Administered 2014-06-04 – 2014-06-05 (×2): 1 via NASAL
  Filled 2014-06-03: qty 16

## 2014-06-03 MED ORDER — FERROUS FUMARATE 325 (106 FE) MG PO TABS
1.0000 | ORAL_TABLET | Freq: Every day | ORAL | Status: DC
  Administered 2014-06-04 – 2014-06-05 (×2): 106 mg via ORAL
  Filled 2014-06-03 (×2): qty 1

## 2014-06-03 MED ORDER — ATROPINE SULFATE 0.1 MG/ML IJ SOLN
1.0000 mg | Freq: Once | INTRAMUSCULAR | Status: AC
  Administered 2014-06-03: 1 mg via INTRAVENOUS

## 2014-06-03 MED ORDER — ACETAMINOPHEN 325 MG PO TABS: 650.0000 mg | ORAL_TABLET | Freq: Four times a day (QID) | ORAL | Status: DC | PRN

## 2014-06-03 MED ORDER — ATROPINE SULFATE 0.1 MG/ML IJ SOLN
INTRAMUSCULAR | Status: AC
  Filled 2014-06-03: qty 10

## 2014-06-03 MED ORDER — ONDANSETRON HCL 4 MG PO TABS: 4.0000 mg | ORAL_TABLET | Freq: Four times a day (QID) | ORAL | Status: DC | PRN

## 2014-06-03 MED ORDER — CEFTRIAXONE SODIUM IN DEXTROSE 20 MG/ML IV SOLN
1.0000 g | INTRAVENOUS | Status: DC
Start: 2014-06-04 — End: 2014-06-05
  Administered 2014-06-04: 1 g via INTRAVENOUS
  Filled 2014-06-03 (×2): qty 50

## 2014-06-03 MED ORDER — FINASTERIDE 5 MG PO TABS
5.0000 mg | ORAL_TABLET | Freq: Every day | ORAL | Status: DC
  Administered 2014-06-04 – 2014-06-05 (×2): 5 mg via ORAL
  Filled 2014-06-03 (×2): qty 1

## 2014-06-03 MED ORDER — CHOLECALCIFEROL 10 MCG (400 UNIT) PO TABS
800.0000 [IU] | ORAL_TABLET | Freq: Every day | ORAL | Status: DC
  Administered 2014-06-04 – 2014-06-05 (×2): 800 [IU] via ORAL
  Filled 2014-06-03 (×2): qty 2

## 2014-06-03 MED ORDER — INSULIN ASPART 100 UNIT/ML ~~LOC~~ SOLN
0.0000 [IU] | Freq: Three times a day (TID) | SUBCUTANEOUS | Status: DC
  Administered 2014-06-04: 1 [IU] via SUBCUTANEOUS

## 2014-06-03 MED ORDER — ACETAMINOPHEN 650 MG RE SUPP: 650.0000 mg | Freq: Four times a day (QID) | RECTAL | Status: DC | PRN

## 2014-06-03 MED ORDER — SODIUM CHLORIDE 0.9 % IJ SOLN: 3.0000 mL | INTRAMUSCULAR | Status: DC | PRN

## 2014-06-03 MED ORDER — SODIUM CHLORIDE 0.9 % IJ SOLN
3.0000 mL | Freq: Two times a day (BID) | INTRAMUSCULAR | Status: DC
  Administered 2014-06-04 – 2014-06-05 (×2): 3 mL via INTRAVENOUS

## 2014-06-03 MED ORDER — INSULIN ASPART 100 UNIT/ML ~~LOC~~ SOLN
10.0000 [IU] | Freq: Once | SUBCUTANEOUS | Status: AC
  Administered 2014-06-03: 10 [IU] via INTRAVENOUS
  Filled 2014-06-03: qty 1

## 2014-06-03 NOTE — ED Notes (Signed)
MD at bedside. 

## 2014-06-03 NOTE — H&P (Signed)
[facility] History and Physical                                                                                    Michael Yang, is a 81 y.o. male  MRN: [id number]   DOB - [birth year]  Admit Date - 06/03/2014  Outpatient Primary MD for the patient is Reymundo Poll, MD  With History of -  Past Medical History  Diagnosis Date  . Hypertension   . Prostate cancer   . Angiospasm   . Diabetes mellitus   . Schizoaffective disorder   . Dementia   . Cataracts, bilateral   . Blindness   . Rhabdomyolysis   . Gastritis   . UTI (lower urinary tract infection)   . GI bleed   . Anemia       Past Surgical History  Procedure Laterality Date  . Colonoscopy  08/08/2011    Procedure: COLONOSCOPY;  Surgeon: Lafayette Dragon, MD;  Location: [facility];  Service: Endoscopy;  Laterality: N/A;  Rm 1240  . Esophagogastroduodenoscopy  08/08/2011    Procedure: ESOPHAGOGASTRODUODENOSCOPY (EGD);  Surgeon: Lafayette Dragon, MD;  Location: [facility];  Service: Endoscopy;  Laterality: N/A;  . Abdominal surgery      patient does not remember what kind of surgery or why he had surgery    in for   Chief Complaint  Patient presents with  . Near Syncope  . Bradycardia     HPI  Michael Yang  is a 81 y.o. male SNF patient with pmh of dementia, htn, schizoaffective disorder, chronic UTI, DM presents to ED after syncopal episode at nursing home. According to EMS, pt had another syncopal episode enroute to ED and HR dropped to 20's to 50's with reported second degree heart block. EMS reported pt's heart rate would drop when he fell asleep and return to normal when waking him up. CBG 139 on arrival. Unable to obtain any information from the patient as he is pretty severely demented. Work up in ED showed mild hyponatremia and hyperkalemia. EKG concerning for 2nd degree heart block. Cardiology has been asked to consult.  Review of Systems   In addition to the HPI above,  No Fever-chills, No Headache, No  changes with Vision or hearing, No problems swallowing food or Liquids, No Chest pain, Cough or Shortness of Breath, No Abdominal pain, No Nausea or Vomiting, Bowel movements are regular, No Blood in stool or Urine, No dysuria, No new skin rashes or bruises, No new joints pains-aches,  No new weakness, tingling, numbness in any extremity, No recent weight gain or loss, A full 10 point Review of Systems was done, except as stated above, all other Review of Systems were negative.  Social History History  Substance Use Topics  . Smoking status: Current Every Day Smoker -- 70 years    Types: Cigarettes  . Smokeless tobacco: Former Systems developer  . Alcohol Use: No    Family History No family history on file.  Prior to Admission medications   Medication Sig Start Date End Date Taking? Authorizing Provider  acetaminophen (TYLENOL) 500 MG tablet Take 500 mg by mouth 3 (three) times daily.  Yes Historical Provider, MD  aspirin EC 81 MG tablet Take 81 mg by mouth every morning.   Yes Historical Provider, MD  buPROPion (WELLBUTRIN SR) 100 MG 12 hr tablet Take 100 mg by mouth daily.   Yes Historical Provider, MD  cholecalciferol (VITAMIN D) 400 UNITS TABS tablet Take 800 Units by mouth daily.   Yes Historical Provider, MD  ferrous fumarate (HEMOCYTE - 106 MG FE) 325 (106 FE) MG TABS tablet Take 1 tablet by mouth daily.   Yes Historical Provider, MD  finasteride (PROSCAR) 5 MG tablet Take 5 mg by mouth daily. *Do not crush.*   Yes Historical Provider, MD  fluticasone (FLONASE) 50 MCG/ACT nasal spray Place 1 spray into the nose every morning.   Yes Historical Provider, MD  furosemide (LASIX) 20 MG tablet Take 20 mg by mouth daily.   Yes Historical Provider, MD  glyBURIDE (DIABETA) 5 MG tablet Take 5 mg by mouth daily with breakfast.   Yes Historical Provider, MD  hydrALAZINE (APRESOLINE) 25 MG tablet Take 25 mg by mouth 3 (three) times daily.   Yes Historical Provider, MD  hydrocerin (EUCERIN) CREA  Apply 1 application topically 2 (two) times daily. To legs and feet. (May keep at bedside.)   Yes Historical Provider, MD  Multiple Vitamin (MULITIVITAMIN WITH MINERALS) TABS Take 1 tablet by mouth every morning.   Yes Historical Provider, MD  omeprazole (PRILOSEC) 40 MG capsule Take 40 mg by mouth every morning.   Yes Historical Provider, MD  ciprofloxacin-hydrocortisone (CIPRO HC) otic suspension Place 3 drops into both ears 2 (two) times daily. Patient not taking: Reported on 06/03/2014 07/04/13   Resa Miner Lawyer, PA-C  furosemide (LASIX) 20 MG tablet Take 1 tablet (20 mg total) by mouth daily. 08/11/11 08/10/12  Velvet Bathe, MD  hydrALAZINE (APRESOLINE) 25 MG tablet Take 1 tablet (25 mg total) by mouth 2 (two) times daily. 08/11/11 08/10/12  Velvet Bathe, MD  mupirocin cream (BACTROBAN) 2 % Apply 1 application topically 2 (two) times daily. To the outer ear canal Patient not taking: Reported on 06/03/2014 07/04/13   Resa Miner Lawyer, PA-C    Allergies  Allergen Reactions  . Hydrochlorothiazide     Hyponatremia per Nephrology    Physical Exam  Vitals  Blood pressure 91/60, pulse 31, temperature 97.1 F (36.2 C), temperature source Rectal, resp. rate 20, SpO2 100 %.   General:  lying in bed in NAD, pleasantly confused  Psych:  demented.  Neuro:   Difficulty cooperating with exam, but no focal deficits noted on exam.  ENT:  Moist oral mucosa without erythema or exudates.  Neck:  Supple, No lymphadenopathy appreciated  Respiratory:  Symmetrical chest wall movement, Good air movement bilaterally, CTAB.  Cardiac:  RRR, No Murmurs, no LE edema noted, no JVD.    Abdomen:  Positive bowel sounds, Soft, Non tender, Non distended,  No masses appreciated  Skin:  No Cyanosis, Normal Skin Turgor, No Skin Rash or Bruise.  Extremities:  Able to move all 4. 5/5 strength in each,  no effusions.  Data Review  CBC  Recent Labs Lab 06/03/14 1353  WBC 10.8*  HGB 10.7*  HCT 32.1*   PLT 320  MCV 94.7  MCH 31.6  MCHC 33.3  RDW 14.6  LYMPHSABS 0.5*  MONOABS 0.9  EOSABS 0.2  BASOSABS 0.0    Chemistries   Recent Labs Lab 06/03/14 1353  NA 131*  K 5.7*  CL 99  CO2 23  GLUCOSE 112*  BUN 18  CREATININE 1.14  CALCIUM 9.1  AST 69*  ALT 22  ALKPHOS 74  BILITOT 1.0    CrCl cannot be calculated (Unknown ideal weight.).  No results for input(s): TSH, T4TOTAL, T3FREE, THYROIDAB in the last 72 hours.  Invalid input(s): FREET3  Coagulation profile  Recent Labs Lab 06/03/14 1353  INR 1.80*     Recent Labs  06/03/14 1353  DDIMER 0.31    Cardiac Enzymes No results for input(s): CKMB, TROPONINI, MYOGLOBIN in the last 168 hours.  Invalid input(s): CK  Invalid input(s): POCBNP  Urinalysis    Component Value Date/Time   COLORURINE YELLOW 06/03/2014 1403   APPEARANCEUR TURBID* 06/03/2014 1403   LABSPEC 1.014 06/03/2014 1403   PHURINE 7.5 06/03/2014 1403   GLUCOSEU NEGATIVE 06/03/2014 1403   HGBUR MODERATE* 06/03/2014 1403   BILIRUBINUR NEGATIVE 06/03/2014 1403   KETONESUR NEGATIVE 06/03/2014 1403   PROTEINUR 100* 06/03/2014 1403   UROBILINOGEN 1.0 06/03/2014 1403   NITRITE NEGATIVE 06/03/2014 1403   LEUKOCYTESUR LARGE* 06/03/2014 1403    Imaging results:   Ct Head Wo Contrast  06/03/2014   CLINICAL DATA:  Syncope, vomiting, bradycardia  EXAM: CT HEAD WITHOUT CONTRAST  TECHNIQUE: Contiguous axial images were obtained from the base of the skull through the vertex without intravenous contrast.  COMPARISON:  None.  FINDINGS: No evidence of parenchymal hemorrhage or extra-axial fluid collection. No mass lesion, mass effect, or midline shift.  No CT evidence of acute infarction.  Subcortical white matter and periventricular small vessel ischemic changes. Intracranial atherosclerosis.  Cerebral volume is within normal limits.  No ventriculomegaly.  The visualized paranasal sinuses are essentially clear. Partial opacification of the right mastoid  air cells.  No evidence of calvarial fracture.  IMPRESSION: No evidence of acute intracranial abnormality.  Small vessel ischemic changes with intracranial atherosclerosis.   Electronically Signed   By: Julian Hy M.D.   On: 06/03/2014 14:45   Dg Chest Port 1 View  06/03/2014   CLINICAL DATA:  Syncope.  EXAM: PORTABLE CHEST - 1 VIEW  COMPARISON:  August 05, 2011.  FINDINGS: The heart size and mediastinal contours are within normal limits. Both lungs are clear. No pneumothorax or pleural effusion is noted. The visualized skeletal structures are unremarkable.  IMPRESSION: No acute cardiopulmonary abnormality seen.   Electronically Signed   By: Marijo Conception, M.D.   On: 06/03/2014 15:39    My personal review of EKG: bradycardic at 31 bpm. Second degree block.   Assessment & Plan  Active Problems:   Anemia   Hyponatremia   Diabetes mellitus, type 2   HTN (hypertension)   Dementia   Syncope   Hyperkalemia   Leukocytosis   Hypercoagulopathy  1. Syncope c bradycardia -concern for 2nd degree heart block. Given Atropine in ED so will send to SDU for now. Will ask EP to evaluated -CT head, chest xray, ddimer all unremarkable. Will check 2Decho -rule out infectious etiology  2. Leukocytosis -send urine for culture, will continue empiric rocephin for now though u/a not great sample -chest xray with no evidence of pna.  -Pt nontoxic appearing and afebrile. Monitor trend  3. Hyperkalemia, mild -treated with kaexylate, IV insulin in ED. Will recheck this pm  4. Hyponatremia, mild -likely some mild volume depletion. Monitor trend to determine need for futher hydration. He did get fluids in ED and prior to arrival. May need to stop home lasix dose in am.  5. Hypercoagulopathy -unclear etiology. No anticoags on med list, LFTs wnl -recheck  in am  6. DM2 -SSI while inpt  7. Anemia, chronic iron-deficiency -stable. Continue home meds  8. Dementia Seemingly at baseline.   Given  advanced age and multiple medical issues, this pt has a very poor long-term prognosis. Extensive workup would likely not change pt's plan of care. Will attempt to contact pt's [facility] [phone]) to discuss goals of care.   DVT Prophylaxis: SCDs  AM Labs Ordered, also please review Full Orders  Family Communication:   None available  Code Status:  full  Condition:  guarded  Time spent in minutes : [place], NP on 06/03/2014 at 4:38 PM Between 7am to 7pm - Pager - [phone] After 7pm go to [email or web address] - password [email or web address]  And look for the night coverage person covering me after hours  [facility]

## 2014-06-03 NOTE — Consult Note (Signed)
CARDIOLOGY CONSULT NOTE     Primary Care Physician: Reymundo Poll, MD Referring Physician:  Admit Date: 06/03/2014  Reason for consultation:  Michael Yang is a 81 y.o. male with a h/o multiple chronic comorbidities including htn, dm, and dementia.  He is admitted with vomiting and syncope.  He was observed at his nursing home today to have vomiting and then brief LOC.  He was brought to [facility] ED where he has been observed to have sinus bradycardia as well as mobitz I second degree AV block.  When asleep, he is more bradycardic however when aroused his heart rates improve.  He was given a dose of atropine in the ER with robust increase in heart rate and 1:1 conduction however his SBP remained 100 mm Hg and he did not symptomatically improve. Upon my history, he says that he is without symtoms.  He denies chest pain, shortness of breath,  Dizziness, or worsening fatigue.     Past Medical History  Diagnosis Date  . Hypertension   . Prostate cancer   . Angiospasm   . Diabetes mellitus   . Schizoaffective disorder   . Dementia   . Cataracts, bilateral   . Blindness   . Rhabdomyolysis   . Gastritis   . UTI (lower urinary tract infection)   . GI bleed   . Anemia    Past Surgical History  Procedure Laterality Date  . Colonoscopy  08/08/2011    Procedure: COLONOSCOPY;  Surgeon: Lafayette Dragon, MD;  Location: [facility];  Service: Endoscopy;  Laterality: N/A;  Rm 1240  . Esophagogastroduodenoscopy  08/08/2011    Procedure: ESOPHAGOGASTRODUODENOSCOPY (EGD);  Surgeon: Lafayette Dragon, MD;  Location: [facility];  Service: Endoscopy;  Laterality: N/A;  . Abdominal surgery      patient does not remember what kind of surgery or why he had surgery   Home medicines reviewed   Allergies  Allergen Reactions  . Hydrochlorothiazide     Hyponatremia per Nephrology    History   Social History  . Marital Status: Widowed    Spouse Name: N/A  . Number of Children: N/A  . Years of  Education: N/A   Occupational History  . Not on file.   Social History Main Topics  . Smoking status: Current Every Day Smoker -- 70 years    Types: Cigarettes  . Smokeless tobacco: Former Systems developer  . Alcohol Use: No  . Drug Use: No  . Sexual Activity: No   Other Topics Concern  . Not on file   Social History Narrative   FH- pt unable to provide due to dementia  ROS- pt unable to provide due to dementia  Physical Exam: Telemetry: Filed Vitals:   06/03/14 1615 06/03/14 1630 06/03/14 1639 06/03/14 1645  BP: 114/44 91/60  114/69  Pulse: 117 31 121 105  Temp:      TempSrc:      Resp: 19 20  12   SpO2: 100% 100%  99%    GEN- The patient is elderly and chronically ill appearing, sleeping but rouses, NAD Head- normocephalic, atraumatic Eyes-  blind Ears- hearing intact Oropharynx- clear Neck- supple,  Lungs- Clear to ausculation bilaterally, normal work of breathing Heart- bradycardic regular rhythm GI- soft, NT, ND, + BS Extremities- no clubbing, cyanosis, + dependant edema MS- no significant deformity or atrophy Skin- no rash or lesion Psych- euthymic mood, full affect Neuro- strength and sensation are intact  EKG reveals sinus rhythm with mobitz I second degree AV  block, RBBB  Labs:   Lab Results  Component Value Date   WBC 10.8* 06/03/2014   HGB 10.7* 06/03/2014   HCT 32.1* 06/03/2014   MCV 94.7 06/03/2014   PLT 320 06/03/2014    Recent Labs Lab 06/03/14 1353  NA 131*  K 5.7*  CL 99  CO2 23  BUN 18  CREATININE 1.14  CALCIUM 9.1  PROT 6.4  BILITOT 1.0  ALKPHOS 74  ALT 22  AST 69*  GLUCOSE 112*   Echo: pending  ASSESSMENT AND PLAN:   1. Sinus bradycardia and mobitz I second degree AV block Likely due to increased vagal tone, improved with atropine. Pt to be admitted to general medicine service I think that telemetry monitoring is adequate for this patient He is presently asymptomatic and therefore there is no acute indication for  pacing. TSH/ echo are pending Avoid chronotropic agents   2. HTN Stable No change required today  3. Dementia He is able to interact appropriately.  I had a long discussion with the patient and he says that he would prefer to "die peacefully" rather than have aggressive measures or resuscitation.  It hink that this would be appropriate.  He is a poor candidate for any invasive procedures.  Primary team to discuss code status with POA (not current available).  I will also order palliative care consultation as I think that this is most appropriate for this patient with a very poor prognosis.    Thompson Grayer, MD 06/03/2014  5:28 PM

## 2014-06-03 NOTE — ED Notes (Addendum)
Per EMS- pt from [facility], had a syncopal episode this AM and vomited.  Per report pt HR was in the 20s.  EMS reports pt in 2nd degree heart block, HR 20s-50s.  BP-113/78 CBG-139.  Pt alert, NAD.  HR on arrival 64.  MD at bedside.  Zoll pads applied and at bedside.

## 2014-06-03 NOTE — ED Notes (Signed)
PT HR dropped to 33, was not increasing with stimulation. Dr. Jeanell Sparrow to bedside.

## 2014-06-03 NOTE — ED Notes (Signed)
Admitting MD made aware CBG 82. No intervention needed at this time.

## 2014-06-03 NOTE — ED Notes (Signed)
Spoke with Melba, pt's POA. Reports they want him to be FULL CODE with CPR and intubation.

## 2014-06-03 NOTE — ED Provider Notes (Addendum)
CSN: [id number]     Arrival date & time 06/03/14  1309 History   First MD Initiated Contact with Patient 06/03/14 1318     Chief Complaint  Patient presents with  . Near Syncope  . Bradycardia   Level V caveat secondary to dementia  (Consider location/radiation/quality/duration/timing/severity/associated sxs/prior Treatment) HPI  Mr. Fitzmaurice is an 81 year old man who is brought in from a nursing home with reports that he hadn't syncopal episode this morning and vomited. EMS called [facility] and stated that his heart rate was in the 20s. They report he was in a second degree heart block and heart rate was in the 20s to 50s. They report that his heart rate decreased after he would fall asleep. They state when they wake him up and he will have his heart rate increased. They report that his CBG was 139 on arrival. His old defibrillator/pacer pads were applied at the bedside. Patient currently has no complaints.  Past Medical History  Diagnosis Date  . Hypertension   . Prostate cancer   . Angiospasm   . Diabetes mellitus   . Schizoaffective disorder   . Dementia   . Cataracts, bilateral   . Blindness   . Rhabdomyolysis   . Gastritis   . UTI (lower urinary tract infection)   . GI bleed   . Anemia    Past Surgical History  Procedure Laterality Date  . Colonoscopy  08/08/2011    Procedure: COLONOSCOPY;  Surgeon: Lafayette Dragon, MD;  Location: [facility];  Service: Endoscopy;  Laterality: N/A;  Rm 1240  . Esophagogastroduodenoscopy  08/08/2011    Procedure: ESOPHAGOGASTRODUODENOSCOPY (EGD);  Surgeon: Lafayette Dragon, MD;  Location: [facility];  Service: Endoscopy;  Laterality: N/A;  . Abdominal surgery      patient does not remember what kind of surgery or why he had surgery   No family history on file. History  Substance Use Topics  . Smoking status: Current Every Day Smoker -- 70 years    Types: Cigarettes  . Smokeless tobacco: Former Systems developer  . Alcohol Use: No    Review of Systems   Unable to perform ROS     Allergies  Hydrochlorothiazide  Home Medications   Prior to Admission medications   Medication Sig Start Date End Date Taking? Authorizing Provider  acetaminophen (TYLENOL) 500 MG tablet Take 500 mg by mouth 3 (three) times daily.   Yes Historical Provider, MD  aspirin EC 81 MG tablet Take 81 mg by mouth every morning.   Yes Historical Provider, MD  buPROPion (WELLBUTRIN SR) 100 MG 12 hr tablet Take 100 mg by mouth daily.   Yes Historical Provider, MD  cholecalciferol (VITAMIN D) 400 UNITS TABS tablet Take 800 Units by mouth daily.   Yes Historical Provider, MD  ferrous fumarate (HEMOCYTE - 106 MG FE) 325 (106 FE) MG TABS tablet Take 1 tablet by mouth daily.   Yes Historical Provider, MD  finasteride (PROSCAR) 5 MG tablet Take 5 mg by mouth daily. *Do not crush.*   Yes Historical Provider, MD  fluticasone (FLONASE) 50 MCG/ACT nasal spray Place 1 spray into the nose every morning.   Yes Historical Provider, MD  furosemide (LASIX) 20 MG tablet Take 20 mg by mouth daily.   Yes Historical Provider, MD  glyBURIDE (DIABETA) 5 MG tablet Take 5 mg by mouth daily with breakfast.   Yes Historical Provider, MD  hydrALAZINE (APRESOLINE) 25 MG tablet Take 25 mg by mouth 3 (three) times daily.  Yes Historical Provider, MD  hydrocerin (EUCERIN) CREA Apply 1 application topically 2 (two) times daily. To legs and feet. (May keep at bedside.)   Yes Historical Provider, MD  Multiple Vitamin (MULITIVITAMIN WITH MINERALS) TABS Take 1 tablet by mouth every morning.   Yes Historical Provider, MD  omeprazole (PRILOSEC) 40 MG capsule Take 40 mg by mouth every morning.   Yes Historical Provider, MD  ciprofloxacin-hydrocortisone (CIPRO HC) otic suspension Place 3 drops into both ears 2 (two) times daily. Patient not taking: Reported on 06/03/2014 07/04/13   Resa Miner Lawyer, PA-C  furosemide (LASIX) 20 MG tablet Take 1 tablet (20 mg total) by mouth daily. 08/11/11 08/10/12  Velvet Bathe,  MD  hydrALAZINE (APRESOLINE) 25 MG tablet Take 1 tablet (25 mg total) by mouth 2 (two) times daily. 08/11/11 08/10/12  Velvet Bathe, MD  mupirocin cream (BACTROBAN) 2 % Apply 1 application topically 2 (two) times daily. To the outer ear canal Patient not taking: Reported on 06/03/2014 07/04/13   Resa Miner Lawyer, PA-C   BP 133/68 mmHg  Pulse 57  Temp(Src) 97.1 F (36.2 C) (Rectal)  Resp 21  SpO2 100% Physical Exam  Constitutional: He appears well-developed and well-nourished.  HENT:  Head: Atraumatic.  Eyes: Conjunctivae and EOM are normal.  Cataract  Neck: Normal range of motion. Neck supple.  Cardiovascular: Normal rate, regular rhythm, normal heart sounds and intact distal pulses.   Pulmonary/Chest: Effort normal and breath sounds normal.  Abdominal: Soft. Bowel sounds are normal.  Musculoskeletal: Normal range of motion. He exhibits no edema or tenderness.  Neurological: He is alert. He has normal reflexes. No cranial nerve deficit. He exhibits normal muscle tone. Coordination normal.  Patient is alert and oriented to questions and follows instructions but is not aware of the date or the place.  Skin: Skin is warm and dry.  Psychiatric: He has a normal mood and affect.  Nursing note and vitals reviewed.   ED Course  Procedures (including critical care time) Labs Review Labs Reviewed  CBC WITH DIFFERENTIAL/PLATELET - Abnormal; Notable for the following:    WBC 10.8 (*)    RBC 3.39 (*)    Hemoglobin 10.7 (*)    HCT 32.1 (*)    Neutrophils Relative % 85 (*)    Neutro Abs 9.3 (*)    Lymphocytes Relative 5 (*)    Lymphs Abs 0.5 (*)    All other components within normal limits  PROTIME-INR - Abnormal; Notable for the following:    Prothrombin Time 21.1 (*)    INR 1.80 (*)    All other components within normal limits  I-STAT ARTERIAL BLOOD GAS, ED - Abnormal; Notable for the following:    pO2, Arterial 77.0 (*)    Bicarbonate 24.2 (*)    All other components within normal  limits  D-DIMER, QUANTITATIVE  BLOOD GAS, ARTERIAL  COMPREHENSIVE METABOLIC PANEL  ETHANOL  BRAIN NATRIURETIC PEPTIDE  SALICYLATE LEVEL  URINALYSIS, ROUTINE W REFLEX MICROSCOPIC  URINE RAPID DRUG SCREEN ([place])  I-STAT TROPOININ, ED    Imaging Review Ct Head Wo Contrast  06/03/2014   CLINICAL DATA:  Syncope, vomiting, bradycardia  EXAM: CT HEAD WITHOUT CONTRAST  TECHNIQUE: Contiguous axial images were obtained from the base of the skull through the vertex without intravenous contrast.  COMPARISON:  None.  FINDINGS: No evidence of parenchymal hemorrhage or extra-axial fluid collection. No mass lesion, mass effect, or midline shift.  No CT evidence of acute infarction.  Subcortical white matter and periventricular  small vessel ischemic changes. Intracranial atherosclerosis.  Cerebral volume is within normal limits.  No ventriculomegaly.  The visualized paranasal sinuses are essentially clear. Partial opacification of the right mastoid air cells.  No evidence of calvarial fracture.  IMPRESSION: No evidence of acute intracranial abnormality.  Small vessel ischemic changes with intracranial atherosclerosis.   Electronically Signed   By: Julian Hy M.D.   On: 06/03/2014 14:45     EKG Interpretation   Date/Time:  Saturday June 03 2014 13:27:05 EST Ventricular Rate:  167 PR Interval:    QRS Duration: 127 QT Interval:  293 QTC Calculation: 488 R Axis:   85 Text Interpretation:  Mobitz I 2-degree AV block (Wenckebach block)  Confirmed by Marye Eagen MD, Andee Poles ([id number]) on 06/03/2014 2:53:58 PM      MDM   Final diagnoses:  Altered mental state    1- syncope- question secondary to bradycardia.  Patient with witnessed bradycardia on monitor- appears to be second degree av block mobitz type 1.  Cardiology consulted.  2- hyperkalemia- kayexalate and insulin ordered. 3- anemia 4- 2 degree ab block mobitz I - pr intervals appear to be lengthening ekg is of poor quality and repeat  remains poor quality.  Plan attempt at third ekg.    Unknown chronicity.  Pacer pads in place.  Hyperkalemia being corrected.   Discussed with Patrici Ranks and plan admission to telemetry.   5- UTI- urine cultured rocephin given.  Shaune Pollack, MD 06/03/14 Gregory, MD 06/03/14 [id number]   4:35 PM Patient with bradycardia to 30s with bp 90/30.  Atropine ordered.  Consult  Placed to cardiology- repeat.  Hospitalist paged regarding change in status.   We are attempting to contact poa to clarify if comfort care as patient voices he does not want aggressive intervention.  CRITICAL CARE Performed by: Shaune Pollack Total critical care time: 75 Critical care time was exclusive of separately billable procedures and treating other patients. Critical care was necessary to treat or prevent imminent or life-threatening deterioration. Critical care was time spent personally by me on the following activities: development of treatment plan with patient and/or surrogate as well as nursing, discussions with consultants, evaluation of patient's response to treatment, examination of patient, obtaining history from patient or surrogate, ordering and performing treatments and interventions, ordering and review of laboratory studies, ordering and review of radiographic studies, pulse oximetry and re-evaluation of patient's condition.   Shaune Pollack, MD 06/03/14 [phone]

## 2014-06-03 NOTE — ED Notes (Signed)
Dr. Rayann Heman at bedside talking with patient.

## 2014-06-03 NOTE — ED Notes (Signed)
Attempted report 

## 2014-06-03 NOTE — ED Notes (Addendum)
Verified with Dr. Clementeen Graham, pt is now DNR.

## 2014-06-04 DIAGNOSIS — R55 Syncope and collapse: Secondary | ICD-10-CM

## 2014-06-04 LAB — BASIC METABOLIC PANEL
Anion gap: 9 (ref 5–15)
BUN: 18 mg/dL (ref 6–23)
CO2: 27 mmol/L (ref 19–32)
Calcium: 8.7 mg/dL (ref 8.4–10.5)
Chloride: 99 mmol/L (ref 96–112)
Creatinine, Ser: 0.98 mg/dL (ref 0.50–1.35)
GFR calc non Af Amer: 71 mL/min — ABNORMAL LOW (ref >90)
GFR, EST AFRICAN AMERICAN: 83 mL/min — AB (ref >90)
Glucose, Bld: 99 mg/dL (ref 70–99)
Potassium: 3.1 mmol/L — ABNORMAL LOW (ref 3.5–5.1)
Sodium: 135 mmol/L (ref 135–145)

## 2014-06-04 LAB — CBC
HEMATOCRIT: 33.3 % — AB (ref 39.0–52.0)
Hemoglobin: 10.8 g/dL — ABNORMAL LOW (ref 13.0–17.0)
MCH: 31.3 pg (ref 26.0–34.0)
MCHC: 32.4 g/dL (ref 30.0–36.0)
MCV: 96.5 fL (ref 78.0–100.0)
Platelets: 291 10*3/uL (ref 150–400)
RBC: 3.45 MIL/uL — AB (ref 4.22–5.81)
RDW: 14.8 % (ref 11.5–15.5)
WBC: 11.2 10*3/uL — ABNORMAL HIGH (ref 4.0–10.5)

## 2014-06-04 LAB — GLUCOSE, CAPILLARY
Glucose-Capillary: 92 mg/dL (ref 70–99)
Glucose-Capillary: 135 mg/dL — ABNORMAL HIGH (ref 70–99)
Glucose-Capillary: 86 mg/dL (ref 70–99)
Glucose-Capillary: 138 mg/dL — ABNORMAL HIGH (ref 70–99)

## 2014-06-04 LAB — TROPONIN I
Troponin I: <0.03 ng/mL (ref <0.031)
Troponin I: <0.03 ng/mL (ref <0.031)

## 2014-06-04 LAB — PROTIME-INR
INR: 1.33 (ref 0.00–1.49)
Prothrombin Time: 16.6 seconds — ABNORMAL HIGH (ref 11.6–15.2)

## 2014-06-04 MED ORDER — POTASSIUM CHLORIDE CRYS ER 20 MEQ PO TBCR: 40.0000 meq | EXTENDED_RELEASE_TABLET | ORAL | Status: DC

## 2014-06-04 MED ORDER — ENSURE COMPLETE PO LIQD
237.0000 mL | Freq: Two times a day (BID) | ORAL | Status: DC
  Administered 2014-06-04 – 2014-06-05 (×4): 237 mL via ORAL

## 2014-06-04 MED ORDER — HEPARIN SODIUM (PORCINE) 5000 UNIT/ML IJ SOLN
5000.0000 [IU] | Freq: Three times a day (TID) | INTRAMUSCULAR | Status: DC
  Administered 2014-06-04 – 2014-06-05 (×4): 5000 [IU] via SUBCUTANEOUS
  Filled 2014-06-04 (×4): qty 1

## 2014-06-04 MED ORDER — POTASSIUM CHLORIDE CRYS ER 20 MEQ PO TBCR
40.0000 meq | EXTENDED_RELEASE_TABLET | Freq: Once | ORAL | Status: AC
  Administered 2014-06-04: 40 meq via ORAL

## 2014-06-04 NOTE — Progress Notes (Signed)
Utilization review completed.  

## 2014-06-04 NOTE — Progress Notes (Signed)
SUBJECTIVE: The patient is doing well today.  He is pleasantly confused and without complaint. At this time, he denies chest pain, shortness of breath, or any new concerns.   Marland Kitchen aspirin EC  81 mg Oral Daily  . buPROPion  100 mg Oral Daily  . cefTRIAXone (ROCEPHIN)  IV  1 g Intravenous Q24H  . Chlorhexidine Gluconate Cloth  6 each Topical Q0600  . cholecalciferol  800 Units Oral Daily  . feeding supplement (ENSURE COMPLETE)  237 mL Oral BID BM  . ferrous fumarate  1 tablet Oral Daily  . finasteride  5 mg Oral Daily  . fluticasone  1 spray Each Nare Daily  . furosemide  20 mg Oral Daily  . hydrALAZINE  25 mg Oral TID  . insulin aspart  0-9 Units Subcutaneous TID WC  . multivitamin with minerals  1 tablet Oral q morning - 10a  . mupirocin ointment  1 application Nasal BID  . pantoprazole  40 mg Oral Daily  . potassium chloride  40 mEq Oral Once  . sodium chloride  3 mL Intravenous Q12H  . sodium chloride  3 mL Intravenous Q12H      OBJECTIVE: Physical Exam: Filed Vitals:   06/03/14 1815 06/03/14 1852 06/03/14 2059 06/04/14 0418  BP: 121/58 154/64 132/58 120/71  Pulse: 66 77 72 91  Temp:  97.3 F (36.3 C) 97.6 F (36.4 C) 98.1 F (36.7 C)  TempSrc:  Oral Oral Oral  Resp: 16 20 22 18   Height:  5\' 2"  (1.575 m)    Weight:  136 lb 3.9 oz (61.8 kg)  134 lb 14.7 oz (61.2 kg)  SpO2: 100% 100% 98% 98%    Intake/Output Summary (Last 24 hours) at 06/04/14 1043 Last data filed at 06/04/14 0100  Gross per 24 hour  Intake    320 ml  Output      0 ml  Net    320 ml    Telemetry reveals sinus rhythm, mobitz I second degree AV block at times.  V rates are stable.  GEN- The patient is elderly and chronically ill appearing, more alert today, NAD Head- normocephalic, atraumatic Eyes- blind Ears- hearing intact Oropharynx- clear Neck- supple,  Lungs- Clear to ausculation bilaterally, normal work of breathing Heart- bradycardic regular rhythm GI- soft, NT, ND, +  BS Extremities- no clubbing, cyanosis, + dependant edema Neuro- strength and sensation are intact   LABS: Basic Metabolic Panel:  Recent Labs  06/03/14 1353 06/03/14 2200 06/04/14 0704  NA 131*  --  135  K 5.7*  --  3.1*  CL 99  --  99  CO2 23  --  27  GLUCOSE 112*  --  99  BUN 18  --  18  CREATININE 1.14 0.94 0.98  CALCIUM 9.1  --  8.7  MG  --  1.9  --    Liver Function Tests:  Recent Labs  06/03/14 1353  AST 69*  ALT 22  ALKPHOS 74  BILITOT 1.0  PROT 6.4  ALBUMIN 3.2*   No results for input(s): LIPASE, AMYLASE in the last 72 hours. CBC:  Recent Labs  06/03/14 1353 06/04/14 0704  WBC 10.8* 11.2*  NEUTROABS 9.3*  --   HGB 10.7* 10.8*  HCT 32.1* 33.3*  MCV 94.7 96.5  PLT 320 291   ASSESSMENT AND PLAN:    1. Sinus bradycardia and mobitz I second degree AV block Likely due to increased vagal tone, improved with atropine.  V rates are  very stable at this time and pt is asymptomatic. Echo is pending Avoid chronotropic agents  Poor candidate for any cardiac procedures  2. HTN Stable No change required today  3. Dementia He is able to interact appropriately. I had a long discussion with the patient and he says that he would prefer to "die peacefully" rather than have aggressive measures or resuscitation. He is appropriately DNR.  I have placed consult for palliative care consultation as I think that this is most appropriate for this patient with a very poor prognosis.  I would anticipate that he could go back to his nursing facility after palliative care consultation.   Thompson Grayer, MD 06/04/2014 10:43 AM

## 2014-06-04 NOTE — Progress Notes (Addendum)
Patient Demographics  Michael Yang, is a 81 y.o. male, DOB - 1923-07-14, WGY:[id number]  Admit date - 06/03/2014   Admitting Physician Louellen Molder, MD  Outpatient Primary MD for the patient is Reymundo Poll, MD  LOS - 1   Chief Complaint  Patient presents with  . Near Syncope  . Bradycardia        Subjective:   Michael Yang today has, No headache, No chest pain, No abdominal pain - No Nausea, No new weakness tingling or numbness, No Cough - SOB. Unreliable historian.  Assessment & Plan    1. Syncope c bradycardia -concern for 2nd degree heart block. Had responded well to atropine, cardiology following. Due to poor baseline status and advanced dementia for now no plan for pacemaker placement. Avoid rate controlling agents.  TSH,Trop stable, pending TTE    2. Leukocytosis due to UTI -Due to UTI, placed on Rocephin monitor.  3. Hypokalemia -replaced  4. Hyponatremia, mild -likely some mild volume depletion. Proving with hydration..  5. Hypercoagulopathy -Likely due to poor nutrition, INR stable now. Monitor.  6. DM2 -SSI while inpt  CBG (last 3)   Recent Labs  06/03/14 1633 06/03/14 2103 06/04/14 0537  GLUCAP 82 115* 92      7. Anemia, chronic iron-deficiency -stable. Continue home meds  8. Dementia Seemingly at baseline.      Code Status: DNR  Family Communication: Called and left message for POA Miss Owens Shark 06-04-14 11.20am  Disposition Plan:  SNF   Procedures     Consults     Medications  Scheduled Meds: . aspirin EC  81 mg Oral Daily  . buPROPion  100 mg Oral Daily  . cefTRIAXone (ROCEPHIN)  IV  1 g Intravenous Q24H  . Chlorhexidine Gluconate Cloth  6 each Topical Q0600  . cholecalciferol  800 Units Oral Daily  . feeding supplement  (ENSURE COMPLETE)  237 mL Oral BID BM  . ferrous fumarate  1 tablet Oral Daily  . finasteride  5 mg Oral Daily  . fluticasone  1 spray Each Nare Daily  . furosemide  20 mg Oral Daily  . hydrALAZINE  25 mg Oral TID  . insulin aspart  0-9 Units Subcutaneous TID WC  . multivitamin with minerals  1 tablet Oral q morning - 10a  . mupirocin ointment  1 application Nasal BID  . pantoprazole  40 mg Oral Daily  . sodium chloride  3 mL Intravenous Q12H  . sodium chloride  3 mL Intravenous Q12H   Continuous Infusions:  PRN Meds:.sodium chloride, acetaminophen **OR** acetaminophen, ondansetron **OR** ondansetron (ZOFRAN) IV, sodium chloride  DVT Prophylaxis    Heparin   Lab Results  Component Value Date   PLT 291 06/04/2014    Antibiotics   Anti-infectives    Start     Dose/Rate Route Frequency Ordered Stop   06/04/14 1600  cefTRIAXone (ROCEPHIN) 1 g in dextrose 5 % 50 mL IVPB - Premix     1 g 100 mL/hr over 30 Minutes Intravenous Every 24 hours 06/03/14 1900     06/03/14 1615  cefTRIAXone (ROCEPHIN) 1 g in dextrose 5 % 50 mL IVPB     1 g 100 mL/hr over 30 Minutes Intravenous  Once 06/03/14 1602  06/03/14 1700          Objective:   Filed Vitals:   06/03/14 1815 06/03/14 1852 06/03/14 2059 06/04/14 0418  BP: 121/58 154/64 132/58 120/71  Pulse: 66 77 72 91  Temp:  97.3 F (36.3 C) 97.6 F (36.4 C) 98.1 F (36.7 C)  TempSrc:  Oral Oral Oral  Resp: 16 20 22 18   Height:  5\' 2"  (1.575 m)    Weight:  61.8 kg (136 lb 3.9 oz)  61.2 kg (134 lb 14.7 oz)  SpO2: 100% 100% 98% 98%    Wt Readings from Last 3 Encounters:  06/04/14 61.2 kg (134 lb 14.7 oz)  09/24/11 66.407 kg (146 lb 6.4 oz)  08/07/11 71.079 kg (156 lb 11.2 oz)     Intake/Output Summary (Last 24 hours) at 06/04/14 1119 Last data filed at 06/04/14 0100  Gross per 24 hour  Intake    320 ml  Output      0 ml  Net    320 ml     Physical Exam  Awake Alert, Oriented X 3, No new F.N deficits, Normal  affect [place].AT,PERRAL Supple Neck,No JVD, No cervical lymphadenopathy appriciated.  Symmetrical Chest wall movement, Good air movement bilaterally, CTAB RRR,No Gallops,Rubs or new Murmurs, No Parasternal Heave +ve B.Sounds, Abd Soft, No tenderness, No organomegaly appriciated, No rebound - guarding or rigidity. No Cyanosis, Clubbing or edema, No new Rash or bruise      Data Review   Micro Results Recent Results (from the past 240 hour(s))  MRSA PCR Screening     Status: Abnormal   Collection Time: 06/03/14  7:59 PM  Result Value Ref Range Status   MRSA by PCR POSITIVE (A) NEGATIVE Final    Comment:        The GeneXpert MRSA Assay (FDA approved for NASAL specimens only), is one component of a comprehensive MRSA colonization surveillance program. It is not intended to diagnose MRSA infection nor to guide or monitor treatment for MRSA infections. RESULT CALLED TO, READ BACK BY AND VERIFIED WITH: ISAACS,T RN AT 2207 06/03/14 [place]     Radiology Reports Ct Head Wo Contrast  06/03/2014   CLINICAL DATA:  Syncope, vomiting, bradycardia  EXAM: CT HEAD WITHOUT CONTRAST  TECHNIQUE: Contiguous axial images were obtained from the base of the skull through the vertex without intravenous contrast.  COMPARISON:  None.  FINDINGS: No evidence of parenchymal hemorrhage or extra-axial fluid collection. No mass lesion, mass effect, or midline shift.  No CT evidence of acute infarction.  Subcortical white matter and periventricular small vessel ischemic changes. Intracranial atherosclerosis.  Cerebral volume is within normal limits.  No ventriculomegaly.  The visualized paranasal sinuses are essentially clear. Partial opacification of the right mastoid air cells.  No evidence of calvarial fracture.  IMPRESSION: No evidence of acute intracranial abnormality.  Small vessel ischemic changes with intracranial atherosclerosis.   Electronically Signed   By: Julian Hy M.D.   On: 06/03/2014 14:45    Dg Chest Port 1 View  06/03/2014   CLINICAL DATA:  Syncope.  EXAM: PORTABLE CHEST - 1 VIEW  COMPARISON:  August 05, 2011.  FINDINGS: The heart size and mediastinal contours are within normal limits. Both lungs are clear. No pneumothorax or pleural effusion is noted. The visualized skeletal structures are unremarkable.  IMPRESSION: No acute cardiopulmonary abnormality seen.   Electronically Signed   By: Marijo Conception, M.D.   On: 06/03/2014 15:39     CBC  Recent Labs  Lab 06/03/14 1353 06/04/14 0704  WBC 10.8* 11.2*  HGB 10.7* 10.8*  HCT 32.1* 33.3*  PLT 320 291  MCV 94.7 96.5  MCH 31.6 31.3  MCHC 33.3 32.4  RDW 14.6 14.8  LYMPHSABS 0.5*  --   MONOABS 0.9  --   EOSABS 0.2  --   BASOSABS 0.0  --     Chemistries   Recent Labs Lab 06/03/14 1353 06/03/14 2200 06/04/14 0704  NA 131*  --  135  K 5.7*  --  3.1*  CL 99  --  99  CO2 23  --  27  GLUCOSE 112*  --  99  BUN 18  --  18  CREATININE 1.14 0.94 0.98  CALCIUM 9.1  --  8.7  MG  --  1.9  --   AST 69*  --   --   ALT 22  --   --   ALKPHOS 74  --   --   BILITOT 1.0  --   --    ------------------------------------------------------------------------------------------------------------------ estimated creatinine clearance is 40.2 mL/min (by C-G formula based on Cr of 0.98). ------------------------------------------------------------------------------------------------------------------ No results for input(s): HGBA1C in the last 72 hours. ------------------------------------------------------------------------------------------------------------------ No results for input(s): CHOL, HDL, LDLCALC, TRIG, CHOLHDL, LDLDIRECT in the last 72 hours. ------------------------------------------------------------------------------------------------------------------  Recent Labs  06/03/14 2200  TSH 0.611   ------------------------------------------------------------------------------------------------------------------ No  results for input(s): VITAMINB12, FOLATE, FERRITIN, TIBC, IRON, RETICCTPCT in the last 72 hours.  Coagulation profile  Recent Labs Lab 06/03/14 1353 06/04/14 0042  INR 1.80* 1.33     Recent Labs  06/03/14 1353  DDIMER 0.31    Cardiac Enzymes  Recent Labs Lab 06/03/14 2200 06/04/14 0042 06/04/14 0704  TROPONINI <0.03 <0.03 <0.03   ------------------------------------------------------------------------------------------------------------------ Invalid input(s): POCBNP     Time Spent in minutes   35   Emmalou Hunger K M.D on 06/04/2014 at 11:19 AM  Between 7am to 7pm - Pager - [phone]  After 7pm go to [email or web address] - password [facility]  [facility]   Office  [phone]

## 2014-06-04 NOTE — Progress Notes (Signed)
  Echocardiogram 2D Echocardiogram has been performed.  Michael Yang 06/04/2014, 12:36 PM

## 2014-06-05 DIAGNOSIS — I4510 Unspecified right bundle-branch block: Secondary | ICD-10-CM | POA: Diagnosis present

## 2014-06-05 LAB — GLUCOSE, CAPILLARY
GLUCOSE-CAPILLARY: 103 mg/dL — AB (ref 70–99)
Glucose-Capillary: 106 mg/dL — ABNORMAL HIGH (ref 70–99)

## 2014-06-05 MED ORDER — HYDRALAZINE HCL 25 MG PO TABS: 25.0000 mg | ORAL_TABLET | Freq: Three times a day (TID) | ORAL | Status: AC

## 2014-06-05 MED ORDER — CEFUROXIME AXETIL 500 MG PO TABS: 500.0000 mg | ORAL_TABLET | Freq: Two times a day (BID) | ORAL | Status: DC

## 2014-06-05 MED ORDER — CEFUROXIME AXETIL 500 MG PO TABS: 500.0000 mg | ORAL_TABLET | Freq: Two times a day (BID) | ORAL | Status: AC

## 2014-06-05 MED ORDER — NICOTINE 21 MG/24HR TD PT24
21.0000 mg | MEDICATED_PATCH | Freq: Every day | TRANSDERMAL | Status: DC
Start: 2014-06-05 — End: 2014-07-13

## 2014-06-05 MED ORDER — FUROSEMIDE 20 MG PO TABS
20.0000 mg | ORAL_TABLET | Freq: Every day | ORAL | Status: AC
Start: 2014-06-05 — End: 2015-06-05

## 2014-06-05 NOTE — Clinical Social Work Psychosocial (Signed)
     Clinical Social Work Department BRIEF PSYCHOSOCIAL ASSESSMENT 06/05/2014  Patient:  [facility]     Account Number:  [id number]     Admit date:  06/03/2014  Clinical Social Worker:  Elam Dutch  Date/Time:  06/05/2014 11:23 PM  Referred by:  Physician  Date Referred:  06/05/2014 Referred for  Other - See comment   Other Referral:   From an ALF-  DC back today 06/05/14   Interview type:  Other - See comment Other interview type:   HCPOA- Melva Owens Shark and [place] is alert and oriented to person only.    PSYCHOSOCIAL DATA Living Status:  FACILITY Admitted from facility:  [facility] Level of care:  Assisted Living Primary support name:  [place] Primary support relationship to patient:  NONE Degree of support available:   Melva is pt's goddaughter and HCPOA    CURRENT CONCERNS Current Concerns  Other - See comment   Other Concerns:   Return to [facility] / PLAN 81 year old male- resident of [facility]. Per MD- patientis stable for d/c back to the facility today. CSW spoke with [place] at [employer]- updated [facility] provided for her review and approval given for patient to return to facility. He is a long term care patient there. Patient iis alert and oiriented to person with plan to return to facility. Patient's Goddaughter Melva confirmed that patient is a long term resident of the facility and agrees to return to facility today.  Copy of fl2 placed on chart.   Assessment/plan status:  No Further Intervention Required Other assessment/ plan:   Information/referral to community resources:   None at this time    PATIENTS/FAMILYS RESPONSE TO PLAN OF CARE: Patient is alert to person only- he has had a sitter with him due to impulsiveness and potential for falls. CSW discussed with [place] from [place]. She stated that a safety sitter in the hospital was not an issue for her to readmit as  he is high risk for falls and he will try to "eat" various items in the room such as the hand soap etc. Patient's HCPOA is agreeable for patient to return to the facility. Nursing notified; facility unable to transport nor could his HCPOA.  EMS services arranged as patient has dementia and is a fall risk. CSW signing off.

## 2014-06-05 NOTE — Discharge Instructions (Signed)
Follow with Primary MD Reymundo Poll, MD in 7 days   Get CBC, CMP, 2 view Chest X ray checked  by Primary MD next visit.    Activity: As tolerated with Full fall precautions use walker/cane & assistance as needed   Disposition SNF     Diet: Heart Healthy  Low Carb, with feeding assistance and aspiration precautions as needed.  For Heart failure patients - Check your Weight same time everyday, if you gain over 2 pounds, or you develop in leg swelling, experience more shortness of breath or chest pain, call your Primary MD immediately. Follow Cardiac Low Salt Diet and 1.5 lit/day fluid restriction.   On your next visit with your primary care physician please Get Medicines reviewed and adjusted.   Please request your Prim.MD to go over all Hospital Tests and Procedure/Radiological results at the follow up, please get all Hospital records sent to your Prim MD by signing hospital release before you go home.   If you experience worsening of your admission symptoms, develop shortness of breath, life threatening emergency, suicidal or homicidal thoughts you must seek medical attention immediately by calling 911 or calling your MD immediately  if symptoms less severe.  You Must read complete instructions/literature along with all the possible adverse reactions/side effects for all the Medicines you take and that have been prescribed to you. Take any new Medicines after you have completely understood and accpet all the possible adverse reactions/side effects.   Do not drive, operating heavy machinery, perform activities at heights, swimming or participation in water activities or provide baby sitting services if your were admitted for syncope or siezures until you have seen by Primary MD or a Neurologist and advised to do so again.  Do not drive when taking Pain medications.    Do not take more than prescribed Pain, Sleep and Anxiety Medications  Special Instructions: If you have smoked or  chewed Tobacco  in the last 2 yrs please stop smoking, stop any regular Alcohol  and or any Recreational drug use.  Wear Seat belts while driving.   Please note  You were cared for by a hospitalist during your hospital stay. If you have any questions about your discharge medications or the care you received while you were in the hospital after you are discharged, you can call the unit and asked to speak with the hospitalist on call if the hospitalist that took care of you is not available. Once you are discharged, your primary care physician will handle any further medical issues. Please note that NO REFILLS for any discharge medications will be authorized once you are discharged, as it is imperative that you return to your primary care physician (or establish a relationship with a primary care physician if you do not have one) for your aftercare needs so that they can reassess your need for medications and monitor your lab values.

## 2014-06-05 NOTE — Discharge Summary (Addendum)
Michael Yang, is a 81 y.o. male  DOB [birth year]  MRN [id number].  Admission date:  06/03/2014  Admitting Physician  No admitting provider for patient encounter.  Discharge Date:  06/05/2014   Primary MD  Reymundo Poll, MD  Recommendations for primary care physician for things to follow:   Monitor clinically.   Admission Diagnosis  Syncope [R55] Altered mental state [R41.82]   Discharge Diagnosis  Syncope [R55] Altered mental state [R41.82]    Principal Problem:   Syncope Active Problems:   Diabetes mellitus, type 2   HTN (hypertension)   Dementia   Bradycardia   Mobitz (type) I (Wenckebach's) atrioventricular block   RBBB      Past Medical History  Diagnosis Date  . Hypertension   . Prostate cancer   . Angiospasm   . Diabetes mellitus   . Schizoaffective disorder   . Dementia   . Cataracts, bilateral   . Blindness   . Rhabdomyolysis   . Gastritis   . UTI (lower urinary tract infection)   . GI bleed   . Anemia     Past Surgical History  Procedure Laterality Date  . Colonoscopy  08/08/2011    Procedure: COLONOSCOPY;  Surgeon: Lafayette Dragon, MD;  Location: [facility];  Service: Endoscopy;  Laterality: N/A;  Rm 1240  . Esophagogastroduodenoscopy  08/08/2011    Procedure: ESOPHAGOGASTRODUODENOSCOPY (EGD);  Surgeon: Lafayette Dragon, MD;  Location: [facility];  Service: Endoscopy;  Laterality: N/A;  . Abdominal surgery      patient does not remember what kind of surgery or why he had surgery       History of present illness and  Hospital Course:     Kindly see H&P for history of present illness and admission details, please review complete Labs, Consult reports and Test reports for all details in brief  HPI  from the history and physical done on the day of admission  Michael Yang is a  81 y.o. male SNF patient with pmh of dementia, htn, schizoaffective disorder, chronic UTI, DM presents to ED after syncopal episode at nursing home. According to EMS, pt had another syncopal episode enroute to ED and HR dropped to 20's to 50's with reported second degree heart block. EMS reported pt's heart rate would drop when he fell asleep and return to normal when waking him up. CBG 139 on arrival. Unable to obtain any information from the patient as he is pretty severely demented. Work up in ED showed mild hyponatremia and hyperkalemia. EKG concerning for 2nd degree heart block. Cardiology has been asked to consult.  Hospital Course    1. Syncope c bradycardia -concern for 2nd degree heart block. Had responded well to atropine, cardiology following. This was likely bradycardia due to increased vagal tone for EP physician, stable heart rate and blood pressure now, Mobitz I second degree AV block is stable per cardiology, completely symptom-free, no further cardiac workup per cardiology.  Avoid rate controlling agents. TSH,Trop stable, stable echogram.    2. Leukocytosis  due to UTI -Due to UTI, placed on Ceftin for 3 more days.  3. Hypokalemia -replaced resolved  4. Hyponatremia, mild -likely some mild volume depletion. Improved with hydration. Monitor BMP, weight and diuretic dose closely.  5. Hypercoagulopathy -Likely due to poor nutrition, INR stable now. Monitor.  6. DM2 stable resume home regimen       7. Anemia, chronic iron-deficiency -stable. Continue home meds  8. Dementia Seemingly at baseline          Discharge Condition: Stable   Follow UP  Follow-up Information    Follow up with Reymundo Poll, MD. Schedule an appointment as soon as possible for a visit in 1 week.   Specialty:  Family Medicine   Why:  Patient will be seen in the Facility    Contact information:   [address] [place] [address] [phone]       Follow up with  Thompson Grayer, MD. Schedule an appointment as soon as possible for a visit on 06/12/2014.   Specialty:  Cardiology   Why:  bradycardia @8 :15 am spoke with [employer] information:   [place] [id number] [phone]         Discharge Instructions  and  Discharge Medications          Discharge Instructions    Discharge instructions    Complete by:  As directed   Follow with Primary MD Reymundo Poll, MD in 7 days   Get CBC, CMP, 2 view Chest X ray checked  by Primary MD next visit.    Activity: As tolerated with Full fall precautions use walker/cane & assistance as needed   Disposition SNF     Diet: Heart Healthy Low Carb , with feeding assistance and aspiration precautions as needed.  For Heart failure patients - Check your Weight same time everyday, if you gain over 2 pounds, or you develop in leg swelling, experience more shortness of breath or chest pain, call your Primary MD immediately. Follow Cardiac Low Salt Diet and 1.5 lit/day fluid restriction.   On your next visit with your primary care physician please Get Medicines reviewed and adjusted.   Please request your Prim.MD to go over all Hospital Tests and Procedure/Radiological results at the follow up, please get all Hospital records sent to your Prim MD by signing hospital release before you go home.   If you experience worsening of your admission symptoms, develop shortness of breath, life threatening emergency, suicidal or homicidal thoughts you must seek medical attention immediately by calling 911 or calling your MD immediately  if symptoms less severe.  You Must read complete instructions/literature along with all the possible adverse reactions/side effects for all the Medicines you take and that have been prescribed to you. Take any new Medicines after you have completely understood and accpet all the possible adverse reactions/side effects.   Do not drive, operating heavy  machinery, perform activities at heights, swimming or participation in water activities or provide baby sitting services if your were admitted for syncope or siezures until you have seen by Primary MD or a Neurologist and advised to do so again.  Do not drive when taking Pain medications.    Do not take more than prescribed Pain, Sleep and Anxiety Medications  Special Instructions: If you have smoked or chewed Tobacco  in the last 2 yrs please stop smoking, stop any regular Alcohol  and or any Recreational drug use.  Wear Seat belts while driving.   Please note  You were cared for by a hospitalist during your hospital stay. If you have any questions about your discharge medications or the care you received while you were in the hospital after you are discharged, you can call the unit and asked to speak with the hospitalist on call if the hospitalist that took care of you is not available. Once you are discharged, your primary care physician will handle any further medical issues. Please note that NO REFILLS for any discharge medications will be authorized once you are discharged, as it is imperative that you return to your primary care physician (or establish a relationship with a primary care physician if you do not have one) for your aftercare needs so that they can reassess your need for medications and monitor your lab values.     Increase activity slowly    Complete by:  As directed             Medication List    STOP taking these medications        ciprofloxacin-hydrocortisone otic suspension  Commonly known as:  CIPRO HC     mupirocin cream 2 %  Commonly known as:  BACTROBAN      TAKE these medications        acetaminophen 500 MG tablet  Commonly known as:  TYLENOL  Take 500 mg by mouth 3 (three) times daily.     aspirin EC 81 MG tablet  Take 81 mg by mouth every morning.     buPROPion 100 MG 12 hr tablet  Commonly known as:  WELLBUTRIN SR  Take 100 mg by mouth  daily.     cefUROXime 500 MG tablet  Commonly known as:  CEFTIN  Take 1 tablet (500 mg total) by mouth 2 (two) times daily with a meal.     cholecalciferol 400 UNITS Tabs tablet  Commonly known as:  VITAMIN D  Take 800 Units by mouth daily.     ferrous fumarate 325 (106 FE) MG Tabs tablet  Commonly known as:  HEMOCYTE - 106 mg FE  Take 1 tablet by mouth daily.     finasteride 5 MG tablet  Commonly known as:  PROSCAR  Take 5 mg by mouth daily. *Do not crush.*     fluticasone 50 MCG/ACT nasal spray  Commonly known as:  FLONASE  Place 1 spray into the nose every morning.     furosemide 20 MG tablet  Commonly known as:  LASIX  Take 1 tablet (20 mg total) by mouth daily.     glyBURIDE 5 MG tablet  Commonly known as:  DIABETA  Take 5 mg by mouth daily with breakfast.     hydrALAZINE 25 MG tablet  Commonly known as:  APRESOLINE  Take 1 tablet (25 mg total) by mouth 3 (three) times daily.     hydrocerin Crea  Apply 1 application topically 2 (two) times daily. To legs and feet. (May keep at bedside.)     multivitamin with minerals Tabs tablet  Take 1 tablet by mouth every morning.     nicotine 21 mg/24hr patch  Commonly known as:  NICODERM CQ  Place 1 patch (21 mg total) onto the skin daily.     omeprazole 40 MG capsule  Commonly known as:  PRILOSEC  Take 40 mg by mouth every morning.          Diet and Activity recommendation: See Discharge Instructions above  Consults obtained - EP Dr Rayann Heman   Major procedures and Radiology Reports - PLEASE review detailed and final reports for all details, in brief -    TTE  - Left ventricle: The cavity size was normal. Wall thickness was normal. Systolic function was normal. The estimated ejection fraction was in the range of 55% to 60%. Doppler parameters are consistent with abnormal left ventricular relaxation (grade 1 diastolic dysfunction). - Right ventricle: RVEF appear moderately depressed.   Ct Head Wo  Contrast  06/03/2014   CLINICAL DATA:  Syncope, vomiting, bradycardia  EXAM: CT HEAD WITHOUT CONTRAST  TECHNIQUE: Contiguous axial images were obtained from the base of the skull through the vertex without intravenous contrast.  COMPARISON:  None.  FINDINGS: No evidence of parenchymal hemorrhage or extra-axial fluid collection. No mass lesion, mass effect, or midline shift.  No CT evidence of acute infarction.  Subcortical white matter and periventricular small vessel ischemic changes. Intracranial atherosclerosis.  Cerebral volume is within normal limits.  No ventriculomegaly.  The visualized paranasal sinuses are essentially clear. Partial opacification of the right mastoid air cells.  No evidence of calvarial fracture.  IMPRESSION: No evidence of acute intracranial abnormality.  Small vessel ischemic changes with intracranial atherosclerosis.   Electronically Signed   By: Julian Hy M.D.   On: 06/03/2014 14:45   Dg Chest Port 1 View  06/03/2014   CLINICAL DATA:  Syncope.  EXAM: PORTABLE CHEST - 1 VIEW  COMPARISON:  August 05, 2011.  FINDINGS: The heart size and mediastinal contours are within normal limits. Both lungs are clear. No pneumothorax or pleural effusion is noted. The visualized skeletal structures are unremarkable.  IMPRESSION: No acute cardiopulmonary abnormality seen.   Electronically Signed   By: Marijo Conception, M.D.   On: 06/03/2014 15:39    Micro Results      Recent Results (from the past 240 hour(s))  Urine culture     Status: None (Preliminary result)   Collection Time: 06/03/14  2:03 PM  Result Value Ref Range Status   Specimen Description URINE, RANDOM  Final   Special Requests Normal  Final   Colony Count   Final    >=100,000 COLONIES/ML Performed at [facility]    Culture   Final    [place] Performed at [facility]    Report Status PENDING  Incomplete  MRSA PCR Screening     Status: Abnormal   Collection Time: 06/03/14  7:59 PM    Result Value Ref Range Status   MRSA by PCR POSITIVE (A) NEGATIVE Final    Comment:        The GeneXpert MRSA Assay (FDA approved for NASAL specimens only), is one component of a comprehensive MRSA colonization surveillance program. It is not intended to diagnose MRSA infection nor to guide or monitor treatment for MRSA infections. RESULT CALLED TO, READ BACK BY AND VERIFIED WITH: ISAACS,T RN AT 2207 06/03/14 MITCHELL,L   Culture, blood (routine x 2)     Status: None (Preliminary result)   Collection Time: 06/03/14 10:00 PM  Result Value Ref Range Status   Specimen Description BLOOD RIGHT ANTECUBITAL  Final   Special Requests BOTTLES DRAWN AEROBIC AND ANAEROBIC 5CC EA  Final   Culture   Final           BLOOD CULTURE RECEIVED NO GROWTH TO DATE CULTURE WILL BE HELD FOR 5 DAYS BEFORE ISSUING A FINAL NEGATIVE REPORT Performed at [facility]  Report Status PENDING  Incomplete  Culture, blood (routine x 2)     Status: None (Preliminary result)   Collection Time: 06/03/14 10:06 PM  Result Value Ref Range Status   Specimen Description BLOOD RIGHT HAND  Final   Special Requests BOTTLES DRAWN AEROBIC AND ANAEROBIC 5CC EA  Final   Culture   Final           BLOOD CULTURE RECEIVED NO GROWTH TO DATE CULTURE WILL BE HELD FOR 5 DAYS BEFORE ISSUING A FINAL NEGATIVE REPORT Performed at [facility]    Report Status PENDING  Incomplete       Today   Subjective:   Michael Yang today has no headache,no chest abdominal pain,no new weakness tingling or numbness, feels much better wants to go home today.    Objective:   Blood pressure 162/62, pulse 91, temperature 99.8 F (37.7 C), temperature source Oral, resp. rate 18, height 5\' 2"  (1.575 m), weight 60.782 kg (134 lb), SpO2 98 %.   Intake/Output Summary (Last 24 hours) at 06/05/14 1206 Last data filed at 06/05/14 1056  Gross per 24 hour  Intake    890 ml  Output    750 ml  Net    140 ml    Exam Awake  Alert, Oriented x 3, No new F.N deficits, Normal affect [place].AT,PERRAL Supple Neck,No JVD, No cervical lymphadenopathy appriciated.  Symmetrical Chest wall movement, Good air movement bilaterally, CTAB RRR,No Gallops,Rubs or new Murmurs, No Parasternal Heave +ve B.Sounds, Abd Soft, Non tender, No organomegaly appriciated, No rebound -guarding or rigidity. No Cyanosis, Clubbing or edema, No new Rash or bruise  Data Review   CBC w Diff:  Lab Results  Component Value Date   WBC 11.2* 06/04/2014   HGB 10.8* 06/04/2014   HCT 33.3* 06/04/2014   PLT 291 06/04/2014   LYMPHOPCT 5* 06/03/2014   MONOPCT 8 06/03/2014   EOSPCT 2 06/03/2014   BASOPCT 0 06/03/2014    CMP:  Lab Results  Component Value Date   NA 135 06/04/2014   K 3.1* 06/04/2014   CL 99 06/04/2014   CO2 27 06/04/2014   BUN 18 06/04/2014   CREATININE 0.98 06/04/2014   PROT 6.4 06/03/2014   ALBUMIN 3.2* 06/03/2014   BILITOT 1.0 06/03/2014   ALKPHOS 74 06/03/2014   AST 69* 06/03/2014   ALT 22 06/03/2014  .  Lab Results  Component Value Date   TSH 0.611 06/03/2014     Total Time in preparing paper work, data evaluation and todays exam - 35 minutes  Thurnell Lose M.D on 06/05/2014 at 12:06 PM  [facility]   Office  [phone]

## 2014-06-05 NOTE — Progress Notes (Signed)
I see that Mr. Michael Yang is planned for discharge today. I am sorry that we were unable to see Mr. Michael Yang prior to discharge in a more timely fashion. Unfortunately we have been experiencing high census with limited providers. I would recommend for palliative to consult and follow at SNF.   Vinie Sill, NP Palliative Medicine Team Pager # [phone] (M-F 8a-5p) Team Phone # [phone] (Nights/Weekends)

## 2014-06-05 NOTE — Progress Notes (Signed)
Subjective:  Up in chair, awake and alert  Objective:  Vital Signs in the last 24 hours: Temp:  [98.1 F (36.7 C)-100.2 F (37.9 C)] 99.8 F (37.7 C) (02/29 0541) Pulse Rate:  [80-97] 91 (02/29 0541) Resp:  [18-20] 18 (02/29 0541) BP: (103-142)/(45-58) 124/55 mmHg (02/29 0541) SpO2:  [96 %-100 %] 98 % (02/29 0541) Weight:  [134 lb (60.782 kg)] 134 lb (60.782 kg) (02/29 0415)  Intake/Output from previous day:  Intake/Output Summary (Last 24 hours) at 06/05/14 0812 Last data filed at 06/05/14 0750  Gross per 24 hour  Intake    840 ml  Output    650 ml  Net    190 ml    Physical Exam: General appearance: alert, cooperative and no distress Lungs: decreased at bases Heart: regularly irregular rhythm   Rate: 78  Rhythm: normal sinus rhythm and 1st degree AVB, intermittent type 2 second degree AVB, and PVCs. overall rates >70, no significant pauses or bradycardia overnight  Lab Results:  Recent Labs  06/03/14 1353 06/04/14 0704  WBC 10.8* 11.2*  HGB 10.7* 10.8*  PLT 320 291    Recent Labs  06/03/14 1353 06/03/14 2200 06/04/14 0704  NA 131*  --  135  K 5.7*  --  3.1*  CL 99  --  99  CO2 23  --  27  GLUCOSE 112*  --  99  BUN 18  --  18  CREATININE 1.14 0.94 0.98    Recent Labs  06/04/14 0042 06/04/14 0704  TROPONINI <0.03 <0.03    Recent Labs  06/04/14 0042  INR 1.33    Imaging: Ct Head Wo Contrast  06/03/2014   CLINICAL DATA:  Syncope, vomiting, bradycardia  EXAM: CT HEAD WITHOUT CONTRAST  TECHNIQUE: Contiguous axial images were obtained from the base of the skull through the vertex without intravenous contrast.  COMPARISON:  None.  FINDINGS: No evidence of parenchymal hemorrhage or extra-axial fluid collection. No mass lesion, mass effect, or midline shift.  No CT evidence of acute infarction.  Subcortical white matter and periventricular small vessel ischemic changes. Intracranial atherosclerosis.  Cerebral volume is within normal limits.  No  ventriculomegaly.  The visualized paranasal sinuses are essentially clear. Partial opacification of the right mastoid air cells.  No evidence of calvarial fracture.  IMPRESSION: No evidence of acute intracranial abnormality.  Small vessel ischemic changes with intracranial atherosclerosis.   Electronically Signed   By: Julian Hy M.D.   On: 06/03/2014 14:45   Dg Chest Port 1 View  06/03/2014   CLINICAL DATA:  Syncope.  EXAM: PORTABLE CHEST - 1 VIEW  COMPARISON:  August 05, 2011.  FINDINGS: The heart size and mediastinal contours are within normal limits. Both lungs are clear. No pneumothorax or pleural effusion is noted. The visualized skeletal structures are unremarkable.  IMPRESSION: No acute cardiopulmonary abnormality seen.   Electronically Signed   By: Marijo Conception, M.D.   On: 06/03/2014 15:39    Cardiac Studies: Echo 06/04/14 Study Conclusions  - Left ventricle: The cavity size was normal. Wall thickness was normal. Systolic function was normal. The estimated ejection fraction was in the range of 55% to 60%. Doppler parameters are consistent with abnormal left ventricular relaxation (grade 1 diastolic dysfunction). - Right ventricle: RVEF appear moderately depressed.  Assessment/Plan:  81 y.o. male with a h/o multiple chronic comorbidities including htn, dm, and dementia. He was admitted from SNF with vomiting and syncope. In the  ED he was observed  to have sinus bradycardia as well as mobitz I second degree AV block, 1st degree AVB, and RBBB. He was on rate limiting agents. His K+ was 5.7 on adm, (not clear why). Since admission his HR has been > 70. Echo shows normal LVF.   Principal Problem:   Syncope Active Problems:   Bradycardia   Mobitz (type) I (Wenckebach's) atrioventricular block   Diabetes mellitus, type 2   Dementia   HTN (hypertension)   RBBB   PLAN: See Dr Jackalyn Lombard note from 06/04/14- pt is DNR. No further cardiac work up. Avoid rate limiting agents.     Kerin Ransom PA-C Beeper [phone] 06/05/2014, 8:12 AM   I have seen, examined the patient, and reviewed the above assessment and plan.  Changes to above are made where necessary.  Doing well at this time without cardiac symptoms.  Mobitz I second degree AV block is stable.  No indication for pacing at this time.  Electrophysiology team to see as needed while here. Please call with questions.   Co Sign: Thompson Grayer, MD 06/05/2014 8:55 AM

## 2014-06-05 NOTE — Progress Notes (Signed)
1430 paged Dr. Candiss Norse regarding discharge orders for pt. Pt is on Rocephin IV but does not have orders for Rocephin in discharge orders. Waiting answer back prior to discharging pt.

## 2014-06-06 LAB — URINE CULTURE: SPECIAL REQUESTS: NORMAL

## 2014-06-10 LAB — CULTURE, BLOOD (ROUTINE X 2)
Culture: NO GROWTH
Culture: NO GROWTH

## 2014-06-12 ENCOUNTER — Encounter: Payer: Medicare Other | Admitting: Internal Medicine

## 2014-06-14 ENCOUNTER — Encounter: Payer: Self-pay | Admitting: Internal Medicine

## 2014-07-13 ENCOUNTER — Encounter: Payer: Self-pay | Admitting: Internal Medicine

## 2014-07-13 ENCOUNTER — Ambulatory Visit (INDEPENDENT_AMBULATORY_CARE_PROVIDER_SITE_OTHER): Payer: Medicare Other | Admitting: Internal Medicine

## 2014-07-13 VITALS — BP 124/62 | HR 82 | Ht 62.0 in | Wt 137.4 lb

## 2014-07-13 DIAGNOSIS — I441 Atrioventricular block, second degree: Secondary | ICD-10-CM

## 2014-07-13 DIAGNOSIS — I10 Essential (primary) hypertension: Secondary | ICD-10-CM | POA: Diagnosis not present

## 2014-07-13 DIAGNOSIS — I495 Sick sinus syndrome: Secondary | ICD-10-CM

## 2014-07-13 NOTE — Patient Instructions (Signed)
Your physician recommends that you schedule a follow-up appointment as needed with Dr Rayann Heman

## 2014-07-14 NOTE — Progress Notes (Signed)
Electrophysiology Office Note   Date:  07/14/2014   ID:  Michael Yang, DOB [birth year], MRN [id number]  PCP:  Michael Poll, MD   Primary Electrophysiologist: Michael Grayer, MD    Chief Complaint  Patient presents with  . Appointment    Mobitz type I Wenckebachs AV block & syncope c bradycardia     History of Present Illness: Michael Yang is a 81 y.o. male who presents today for electrophysiology evaluation.   He has done well since hospital discharge.  He has had several falls due to unsteadiness.  He is elderly and chronically ill.    Today, he denies symptoms of palpitations, chest pain, shortness of breath, orthopnea, PND, lower extremity edema,  bleeding, or neurologic sequela. The patient is tolerating medications without difficulties and is otherwise without complaint today.  He has poor vision and hearing.  Past Medical History  Diagnosis Date  . Hypertension   . Prostate cancer   . Angiospasm   . Diabetes mellitus   . Schizoaffective disorder   . Dementia   . Cataracts, bilateral   . Blindness   . Rhabdomyolysis   . Gastritis   . UTI (lower urinary tract infection)   . GI bleed   . Anemia    Past Surgical History  Procedure Laterality Date  . Colonoscopy  08/08/2011    Procedure: COLONOSCOPY;  Surgeon: Lafayette Dragon, MD;  Location: [facility];  Service: Endoscopy;  Laterality: N/A;  Rm 1240  . Esophagogastroduodenoscopy  08/08/2011    Procedure: ESOPHAGOGASTRODUODENOSCOPY (EGD);  Surgeon: Lafayette Dragon, MD;  Location: [facility];  Service: Endoscopy;  Laterality: N/A;  . Abdominal surgery      patient does not remember what kind of surgery or why he had surgery     Current Outpatient Prescriptions  Medication Sig Dispense Refill  . acetaminophen (TYLENOL) 500 MG tablet Take 500 mg by mouth 3 (three) times daily.    Marland Kitchen aspirin EC 81 MG tablet Take 81 mg by mouth every morning.    Marland Kitchen buPROPion (WELLBUTRIN SR) 100 MG 12 hr tablet Take 100 mg by mouth daily.     . cholecalciferol (VITAMIN D) 400 UNITS TABS tablet Take 800 Units by mouth daily.    . Ferrous Fumarate 324 MG TABS Take 1 tablet by mouth daily.    . finasteride (PROSCAR) 5 MG tablet Take 5 mg by mouth daily. *Do not crush.*    . fluticasone (FLONASE) 50 MCG/ACT nasal spray Place 1 spray into the nose every morning.    . furosemide (LASIX) 20 MG tablet Take 1 tablet (20 mg total) by mouth daily. 30 tablet 0  . glyBURIDE (DIABETA) 5 MG tablet Take 5 mg by mouth daily with breakfast.    . hydrALAZINE (APRESOLINE) 25 MG tablet Take 1 tablet (25 mg total) by mouth 3 (three) times daily. 90 tablet 0  . hydrocerin (EUCERIN) CREA Apply 1 application topically 2 (two) times daily. To legs and feet. (May keep at bedside.)    . Multiple Vitamin (MULITIVITAMIN WITH MINERALS) TABS Take 1 tablet by mouth every morning.    Marland Kitchen omeprazole (PRILOSEC) 40 MG capsule Take 40 mg by mouth every morning.     No current facility-administered medications for this visit.    Allergies:   Hydrochlorothiazide   Social History:  The patient  reports that he has been smoking Cigarettes.  He has smoked for the past 70 years. He has quit using smokeless tobacco. He reports that he does  not drink alcohol or use illicit drugs.   ROS:  Please see the history of present illness.   All other systems are reviewed and negative.    PHYSICAL EXAM: VS:  BP 124/62 mmHg  Pulse 82  Ht 5\' 2"  (1.575 m)  Wt 137 lb 6.4 oz (62.324 kg)  BMI 25.12 kg/m2 , BMI Body mass index is 25.12 kg/(m^2). GEN: Well nourished, well developed, in no acute distress HEENT: normal, very poor vision and hearing Neck: no JVD, carotid bruits, or masses Cardiac: RRR  Respiratory:  clear to auscultation bilaterally, normal work of breathing GI: soft, nontender, nondistended, + BS MS: no deformity or atrophy Skin: warm and dry  Neuro:  Strength and sensation are intact Psych: euthymic mood, full affect  Recent Labs: 06/03/2014: ALT 22; B  Natriuretic Peptide 41.6; Magnesium 1.9; TSH 0.611 06/04/2014: BUN 18; Creatinine 0.98; Hemoglobin 10.8*; Platelets 291; Potassium 3.1*; Sodium 135    Lipid Panel  No results found for: CHOL, TRIG, HDL, CHOLHDL, VLDL, LDLCALC, LDLDIRECT   Wt Readings from Last 3 Encounters:  07/13/14 137 lb 6.4 oz (62.324 kg)  06/05/14 134 lb (60.782 kg)  09/24/11 146 lb 6.4 oz (66.407 kg)      ASSESSMENT AND PLAN:  1.  Mobitz I second degree AV block Minimally symptomatic Not a candidate for EP procedures  2. HTN Stable No change required today  DNI/DNR  Palliative measures are appropriate long term.  I discussed this with his family today.  Return as needed  Signed, Michael Grayer, MD  07/14/2014 10:53 PM     [address] [place] [address] [phone] (office) [phone] (fax)

## 2014-08-11 ENCOUNTER — Emergency Department (HOSPITAL_COMMUNITY): Payer: Medicare Other

## 2014-08-11 ENCOUNTER — Encounter (HOSPITAL_COMMUNITY): Payer: Self-pay | Admitting: Emergency Medicine

## 2014-08-11 ENCOUNTER — Inpatient Hospital Stay (HOSPITAL_COMMUNITY)
Admission: EM | Admit: 2014-08-11 | Discharge: 2014-08-13 | DRG: 638 | Disposition: A | Discharge status: Skilled Nursing Facility | Payer: Medicare Other | Attending: Internal Medicine | Admitting: Internal Medicine

## 2014-08-11 ENCOUNTER — Observation Stay (HOSPITAL_COMMUNITY): Payer: Medicare Other

## 2014-08-11 DIAGNOSIS — F17210 Nicotine dependence, cigarettes, uncomplicated: Secondary | ICD-10-CM | POA: Diagnosis present

## 2014-08-11 DIAGNOSIS — E11649 Type 2 diabetes mellitus with hypoglycemia without coma: Principal | ICD-10-CM | POA: Diagnosis present

## 2014-08-11 DIAGNOSIS — I739 Peripheral vascular disease, unspecified: Secondary | ICD-10-CM | POA: Diagnosis present

## 2014-08-11 DIAGNOSIS — E871 Hypo-osmolality and hyponatremia: Secondary | ICD-10-CM | POA: Diagnosis present

## 2014-08-11 DIAGNOSIS — E162 Hypoglycemia, unspecified: Secondary | ICD-10-CM | POA: Diagnosis not present

## 2014-08-11 DIAGNOSIS — C3490 Malignant neoplasm of unspecified part of unspecified bronchus or lung: Secondary | ICD-10-CM | POA: Diagnosis present

## 2014-08-11 DIAGNOSIS — H269 Unspecified cataract: Secondary | ICD-10-CM | POA: Diagnosis present

## 2014-08-11 DIAGNOSIS — R55 Syncope and collapse: Secondary | ICD-10-CM

## 2014-08-11 DIAGNOSIS — H540 Blindness, both eyes: Secondary | ICD-10-CM | POA: Diagnosis present

## 2014-08-11 DIAGNOSIS — T383X1A Poisoning by insulin and oral hypoglycemic [antidiabetic] drugs, accidental (unintentional), initial encounter: Secondary | ICD-10-CM

## 2014-08-11 DIAGNOSIS — I10 Essential (primary) hypertension: Secondary | ICD-10-CM

## 2014-08-11 DIAGNOSIS — F0390 Unspecified dementia without behavioral disturbance: Secondary | ICD-10-CM | POA: Diagnosis present

## 2014-08-11 DIAGNOSIS — E222 Syndrome of inappropriate secretion of antidiuretic hormone: Secondary | ICD-10-CM | POA: Diagnosis present

## 2014-08-11 DIAGNOSIS — Z720 Tobacco use: Secondary | ICD-10-CM | POA: Diagnosis present

## 2014-08-11 DIAGNOSIS — D649 Anemia, unspecified: Secondary | ICD-10-CM | POA: Diagnosis present

## 2014-08-11 DIAGNOSIS — D638 Anemia in other chronic diseases classified elsewhere: Secondary | ICD-10-CM | POA: Diagnosis present

## 2014-08-11 DIAGNOSIS — Z8546 Personal history of malignant neoplasm of prostate: Secondary | ICD-10-CM

## 2014-08-11 DIAGNOSIS — Z7951 Long term (current) use of inhaled steroids: Secondary | ICD-10-CM

## 2014-08-11 DIAGNOSIS — E160 Drug-induced hypoglycemia without coma: Secondary | ICD-10-CM

## 2014-08-11 DIAGNOSIS — R918 Other nonspecific abnormal finding of lung field: Secondary | ICD-10-CM

## 2014-08-11 DIAGNOSIS — E119 Type 2 diabetes mellitus without complications: Secondary | ICD-10-CM

## 2014-08-11 DIAGNOSIS — T68XXXA Hypothermia, initial encounter: Secondary | ICD-10-CM

## 2014-08-11 DIAGNOSIS — T383X4A Poisoning by insulin and oral hypoglycemic [antidiabetic] drugs, undetermined, initial encounter: Secondary | ICD-10-CM

## 2014-08-11 DIAGNOSIS — Z885 Allergy status to narcotic agent status: Secondary | ICD-10-CM

## 2014-08-11 DIAGNOSIS — Z7982 Long term (current) use of aspirin: Secondary | ICD-10-CM

## 2014-08-11 DIAGNOSIS — Z22322 Carrier or suspected carrier of Methicillin resistant Staphylococcus aureus: Secondary | ICD-10-CM

## 2014-08-11 DIAGNOSIS — F259 Schizoaffective disorder, unspecified: Secondary | ICD-10-CM | POA: Diagnosis present

## 2014-08-11 HISTORY — DX: Other nonspecific abnormal finding of lung field: R91.8

## 2014-08-11 HISTORY — DX: Syncope and collapse: R55

## 2014-08-11 HISTORY — DX: Carrier or suspected carrier of methicillin resistant Staphylococcus aureus: Z22.322

## 2014-08-11 LAB — COMPREHENSIVE METABOLIC PANEL WITH GFR
ALT: 21 U/L (ref 17–63)
AST: 37 U/L (ref 15–41)
Albumin: 3.3 g/dL — ABNORMAL LOW (ref 3.5–5.0)
Alkaline Phosphatase: 113 U/L (ref 38–126)
Anion gap: 13 (ref 5–15)
BUN: 14 mg/dL (ref 6–20)
CO2: 20 mmol/L — ABNORMAL LOW (ref 22–32)
Calcium: 8.9 mg/dL (ref 8.9–10.3)
Chloride: 98 mmol/L — ABNORMAL LOW (ref 101–111)
Creatinine, Ser: 0.75 mg/dL (ref 0.61–1.24)
GFR calc Af Amer: >60 mL/min
GFR calc non Af Amer: >60 mL/min
Glucose, Bld: 52 mg/dL — ABNORMAL LOW (ref 70–99)
Potassium: 4.1 mmol/L (ref 3.5–5.1)
Sodium: 131 mmol/L — ABNORMAL LOW (ref 135–145)
Total Bilirubin: 0.4 mg/dL (ref 0.3–1.2)
Total Protein: 7.5 g/dL (ref 6.5–8.1)

## 2014-08-11 LAB — CBC WITH DIFFERENTIAL/PLATELET
Basophils Absolute: 0.1 10*3/uL (ref 0.0–0.1)
Basophils Relative: 1 % (ref 0–1)
EOS PCT: 0 % (ref 0–5)
Eosinophils Absolute: 0.1 10*3/uL (ref 0.0–0.7)
HCT: 35.6 % — ABNORMAL LOW (ref 39.0–52.0)
Hemoglobin: 11.9 g/dL — ABNORMAL LOW (ref 13.0–17.0)
LYMPHS ABS: 0.4 10*3/uL — AB (ref 0.7–4.0)
LYMPHS PCT: 3 % — AB (ref 12–46)
MCH: 30.7 pg (ref 26.0–34.0)
MCHC: 33.4 g/dL (ref 30.0–36.0)
MCV: 92 fL (ref 78.0–100.0)
Monocytes Absolute: 0.7 10*3/uL (ref 0.1–1.0)
Monocytes Relative: 6 % (ref 3–12)
NEUTROS ABS: 10.3 10*3/uL — AB (ref 1.7–7.7)
NEUTROS PCT: 90 % — AB (ref 43–77)
Platelets: 312 10*3/uL (ref 150–400)
RBC: 3.87 MIL/uL — AB (ref 4.22–5.81)
RDW: 13.3 % (ref 11.5–15.5)
WBC: 11.5 10*3/uL — ABNORMAL HIGH (ref 4.0–10.5)

## 2014-08-11 LAB — URINALYSIS, ROUTINE W REFLEX MICROSCOPIC
Bilirubin Urine: NEGATIVE
Glucose, UA: NEGATIVE mg/dL
Ketones, ur: NEGATIVE mg/dL
Nitrite: POSITIVE — AB
Protein, ur: NEGATIVE mg/dL
Specific Gravity, Urine: 1.006 (ref 1.005–1.030)
Urobilinogen, UA: 0.2 mg/dL (ref 0.0–1.0)
pH: 7.5 (ref 5.0–8.0)

## 2014-08-11 LAB — TROPONIN I: Troponin I: <0.03 ng/mL (ref <0.031)

## 2014-08-11 LAB — URINE MICROSCOPIC-ADD ON

## 2014-08-11 LAB — CBG MONITORING, ED
GLUCOSE-CAPILLARY: 125 mg/dL — AB (ref 70–99)
Glucose-Capillary: 85 mg/dL (ref 70–99)
Glucose-Capillary: 48 mg/dL — ABNORMAL LOW (ref 70–99)

## 2014-08-11 LAB — MRSA PCR SCREENING: MRSA by PCR: POSITIVE — AB

## 2014-08-11 LAB — LACTIC ACID, PLASMA
LACTIC ACID, VENOUS: 1.3 mmol/L (ref 0.5–2.0)
Lactic Acid, Venous: 1 mmol/L (ref 0.5–2.0)

## 2014-08-11 LAB — GLUCOSE, CAPILLARY
Glucose-Capillary: 60 mg/dL — ABNORMAL LOW (ref 70–99)
Glucose-Capillary: 113 mg/dL — ABNORMAL HIGH (ref 70–99)

## 2014-08-11 LAB — CK: Total CK: 254 U/L (ref 49–397)

## 2014-08-11 MED ORDER — ACETAMINOPHEN 500 MG PO TABS
500.0000 mg | ORAL_TABLET | Freq: Three times a day (TID) | ORAL | Status: DC
  Administered 2014-08-11 – 2014-08-13 (×6): 500 mg via ORAL
  Filled 2014-08-11 (×7): qty 1

## 2014-08-11 MED ORDER — FINASTERIDE 5 MG PO TABS
5.0000 mg | ORAL_TABLET | ORAL | Status: DC
  Administered 2014-08-12 – 2014-08-13 (×2): 5 mg via ORAL
  Filled 2014-08-11 (×3): qty 1

## 2014-08-11 MED ORDER — DEXTROSE 50 % IV SOLN
INTRAVENOUS | Status: AC
  Administered 2014-08-11: 50 mL via INTRAVENOUS
  Filled 2014-08-11: qty 50

## 2014-08-11 MED ORDER — HYDROCERIN EX CREA
1.0000 "application " | TOPICAL_CREAM | Freq: Two times a day (BID) | CUTANEOUS | Status: DC
  Administered 2014-08-11 – 2014-08-13 (×4): 1 via TOPICAL
  Filled 2014-08-11: qty 113

## 2014-08-11 MED ORDER — ASPIRIN EC 81 MG PO TBEC
81.0000 mg | DELAYED_RELEASE_TABLET | Freq: Every morning | ORAL | Status: DC
  Administered 2014-08-12 – 2014-08-13 (×2): 81 mg via ORAL
  Filled 2014-08-11 (×2): qty 1

## 2014-08-11 MED ORDER — PANTOPRAZOLE SODIUM 40 MG PO TBEC
80.0000 mg | DELAYED_RELEASE_TABLET | Freq: Every day | ORAL | Status: DC
  Administered 2014-08-12 – 2014-08-13 (×2): 80 mg via ORAL

## 2014-08-11 MED ORDER — SODIUM CHLORIDE 0.9 % IV SOLN
INTRAVENOUS | Status: DC
  Administered 2014-08-11: 18:00:00 via INTRAVENOUS

## 2014-08-11 MED ORDER — FUROSEMIDE 20 MG PO TABS
20.0000 mg | ORAL_TABLET | Freq: Every day | ORAL | Status: DC
  Administered 2014-08-12 – 2014-08-13 (×2): 20 mg via ORAL
  Filled 2014-08-11 (×2): qty 1

## 2014-08-11 MED ORDER — FERROUS FUMARATE 324 MG PO TABS: 1.0000 | ORAL_TABLET | ORAL | Status: DC

## 2014-08-11 MED ORDER — DEXTROSE-NACL 5-0.45 % IV SOLN: INTRAVENOUS | Status: DC

## 2014-08-11 MED ORDER — FERROUS FUMARATE 325 (106 FE) MG PO TABS
1.0000 | ORAL_TABLET | Freq: Every day | ORAL | Status: DC
  Administered 2014-08-12 – 2014-08-13 (×2): 106 mg via ORAL
  Filled 2014-08-11 (×2): qty 1

## 2014-08-11 MED ORDER — DEXTROSE 50 % IV SOLN
50.0000 mL | Freq: Once | INTRAVENOUS | Status: AC
  Administered 2014-08-11 – 2014-08-12 (×3): 50 mL via INTRAVENOUS
  Filled 2014-08-11: qty 50

## 2014-08-11 MED ORDER — DEXTROSE-NACL 5-0.9 % IV SOLN
INTRAVENOUS | Status: DC
  Administered 2014-08-11 (×2): via INTRAVENOUS

## 2014-08-11 MED ORDER — FLUTICASONE PROPIONATE 50 MCG/ACT NA SUSP
1.0000 | NASAL | Status: DC
  Administered 2014-08-12 – 2014-08-13 (×2): 1 via NASAL
  Filled 2014-08-11: qty 16

## 2014-08-11 MED ORDER — CHOLECALCIFEROL 10 MCG (400 UNIT) PO TABS
800.0000 [IU] | ORAL_TABLET | ORAL | Status: DC
  Administered 2014-08-12 – 2014-08-13 (×2): 800 [IU] via ORAL
  Filled 2014-08-11 (×3): qty 2

## 2014-08-11 MED ORDER — BUPROPION HCL ER (SR) 100 MG PO TB12
100.0000 mg | ORAL_TABLET | ORAL | Status: DC
  Administered 2014-08-12 – 2014-08-13 (×2): 100 mg via ORAL
  Filled 2014-08-11 (×3): qty 1

## 2014-08-11 MED ORDER — DEXTROSE-NACL 5-0.45 % IV SOLN
INTRAVENOUS | Status: AC
  Administered 2014-08-11: 21:00:00 via INTRAVENOUS

## 2014-08-11 MED ORDER — HEPARIN SODIUM (PORCINE) 5000 UNIT/ML IJ SOLN
5000.0000 [IU] | Freq: Three times a day (TID) | INTRAMUSCULAR | Status: DC
  Administered 2014-08-11 – 2014-08-13 (×6): 5000 [IU] via SUBCUTANEOUS
  Filled 2014-08-11 (×6): qty 1

## 2014-08-11 MED ORDER — HYDRALAZINE HCL 25 MG PO TABS
25.0000 mg | ORAL_TABLET | Freq: Three times a day (TID) | ORAL | Status: DC
  Administered 2014-08-11 – 2014-08-13 (×6): 25 mg via ORAL
  Filled 2014-08-11 (×7): qty 1

## 2014-08-11 NOTE — ED Notes (Signed)
Per EMS pt from nursing home and was found uncon.on floor by nursing staff. Pt noted to have CBG 33 upon arrival, EMS admin 25g D50. CBG 174 post admin. Pt is alert at this time.Pt has hx of dementia.

## 2014-08-11 NOTE — ED Notes (Signed)
This RN ordered a carb-modified dinner tray for the pt.

## 2014-08-11 NOTE — ED Notes (Signed)
Gardener, DO at bedside.  

## 2014-08-11 NOTE — Progress Notes (Signed)
Hypoglycemic Event  CBG: 60  Treatment: D50 IV 25 mL  Symptoms: None  Follow-up CBG: Time:22:45  CBG Result:113  Possible Reasons for Event: Inadequate meal intake  Comments/MD notified:no, d50 administered    Michael Yang Michael Yang  Remember to initiate Hypoglycemia Order Set & complete

## 2014-08-11 NOTE — H&P (Addendum)
[facility] History and Physical  Michael Yang OHY:[id number] DOB: 01-06-1924 DOA: 08/11/2014  Referring physician: EDP PCP: Reymundo Poll, MD   Chief Complaint: Found down   HPI: Michael Yang is a 81 y.o. male with h/o DM2, found down on bathroom floor at [facility], patients BGL was 33 initially and his temperature on arrival to ED was 94.8.  He has been given D50 and put on a bair hugger.  While his temperature has come up and he is now 81, he continues to be periodically hypoglycemic and requiring additional PO glucose intake.  He was initially unconscious at NH, he is now awake and alert at this time.  Review of Systems: Systems reviewed.  As above, otherwise negative.  Patient is demented per what is probably his baseline, he is able to state that he has no pain anywhere, with movement, etc.  Past Medical History  Diagnosis Date  . Hypertension   . Prostate cancer   . Angiospasm   . Diabetes mellitus   . Schizoaffective disorder   . Dementia   . Cataracts, bilateral   . Blindness   . Rhabdomyolysis   . Gastritis   . UTI (lower urinary tract infection)   . GI bleed   . Anemia    Past Surgical History  Procedure Laterality Date  . Colonoscopy  08/08/2011    Procedure: COLONOSCOPY;  Surgeon: Lafayette Dragon, MD;  Location: [facility];  Service: Endoscopy;  Laterality: N/A;  Rm 1240  . Esophagogastroduodenoscopy  08/08/2011    Procedure: ESOPHAGOGASTRODUODENOSCOPY (EGD);  Surgeon: Lafayette Dragon, MD;  Location: [facility];  Service: Endoscopy;  Laterality: N/A;  . Abdominal surgery      patient does not remember what kind of surgery or why he had surgery   Social History:  reports that he has been smoking Cigarettes.  He has smoked for the past 70 years. He has quit using smokeless tobacco. He reports that he does not drink alcohol or use illicit drugs.  Allergies  Allergen Reactions  . Hydrochlorothiazide     Hyponatremia per Nephrology    History reviewed. No pertinent  family history.   Prior to Admission medications   Medication Sig Start Date End Date Taking? Authorizing Provider  acetaminophen (TYLENOL) 500 MG tablet Take 500 mg by mouth 3 (three) times daily.   Yes Historical Provider, MD  aspirin EC 81 MG tablet Take 81 mg by mouth every morning.   Yes Historical Provider, MD  buPROPion (WELLBUTRIN SR) 100 MG 12 hr tablet Take 100 mg by mouth every morning.    Yes Historical Provider, MD  cholecalciferol (VITAMIN D) 400 UNITS TABS tablet Take 800 Units by mouth every morning.    Yes Historical Provider, MD  Ferrous Fumarate 324 MG TABS Take 1 tablet by mouth every morning.    Yes Historical Provider, MD  finasteride (PROSCAR) 5 MG tablet Take 5 mg by mouth every morning. *Do not crush.*   Yes Historical Provider, MD  fluticasone (FLONASE) 50 MCG/ACT nasal spray Place 1 spray into the nose every morning.    Yes Historical Provider, MD  furosemide (LASIX) 20 MG tablet Take 1 tablet (20 mg total) by mouth daily. 06/05/14 06/05/15 Yes Thurnell Lose, MD  glyBURIDE (DIABETA) 5 MG tablet Take 5 mg by mouth daily with breakfast.   Yes Historical Provider, MD  hydrALAZINE (APRESOLINE) 25 MG tablet Take 1 tablet (25 mg total) by mouth 3 (three) times daily. 06/05/14  Yes Thurnell Lose, MD  hydrocerin (EUCERIN) CREA Apply 1 application topically 2 (two) times daily. To legs and feet. (May keep at bedside.)   Yes Historical Provider, MD  omeprazole (PRILOSEC) 40 MG capsule Take 40 mg by mouth every morning.   Yes Historical Provider, MD   Physical Exam: Filed Vitals:   08/11/14 1918  BP:   Pulse:   Temp: 97 F (36.1 C)  Resp:     BP 173/71 mmHg  Pulse 74  Temp(Src) 97 F (36.1 C) (Rectal)  Resp 16  SpO2 97%  General Appearance:    Alert, seems to understand why we are admitting him to hospital, I suspect he is at baseline mental status, no distress, appears stated age  Head:    Normocephalic, atraumatic  Eyes:    PERRL, EOMI, sclera non-icteric         Nose:   Nares without drainage or epistaxis. Mucosa, turbinates normal  Throat:   Moist mucous membranes. Oropharynx without erythema or exudate.  Neck:   Supple. No carotid bruits.  No thyromegaly.  No lymphadenopathy.   Back:     No CVA tenderness, no spinal tenderness  Lungs:     Clear to auscultation bilaterally, without wheezes, rhonchi or rales  Chest wall:    No tenderness to palpitation  Heart:    Regular rate and rhythm without murmurs, gallops, rubs  Abdomen:     Soft, non-tender, nondistended, normal bowel sounds, no organomegaly  Genitalia:    deferred  Rectal:    deferred  Extremities:   No clubbing, cyanosis or edema.  Pulses:   2+ and symmetric all extremities  Skin:   Skin color, texture, turgor normal, no rashes or lesions  Lymph nodes:   Cervical, supraclavicular, and axillary nodes normal  Neurologic:   CNII-XII intact. Normal strength, sensation and reflexes      throughout    Labs on Admission:  Basic Metabolic Panel:  Recent Labs Lab 08/11/14 1800  NA 131*  K 4.1  CL 98*  CO2 20*  GLUCOSE 52*  BUN 14  CREATININE 0.75  CALCIUM 8.9   Liver Function Tests:  Recent Labs Lab 08/11/14 1800  AST 37  ALT 21  ALKPHOS 113  BILITOT 0.4  PROT 7.5  ALBUMIN 3.3*   No results for input(s): LIPASE, AMYLASE in the last 168 hours. No results for input(s): AMMONIA in the last 168 hours. CBC:  Recent Labs Lab 08/11/14 1800  WBC 11.5*  NEUTROABS 10.3*  HGB 11.9*  HCT 35.6*  MCV 92.0  PLT 312   Cardiac Enzymes:  Recent Labs Lab 08/11/14 1800  CKTOTAL 254  TROPONINI <0.03    BNP (last 3 results) No results for input(s): PROBNP in the last 8760 hours. CBG:  Recent Labs Lab 08/11/14 1640 08/11/14 1855  GLUCAP 85 48*    Radiological Exams on Admission: Dg Chest 2 View  08/11/2014   CLINICAL DATA:  [place] on the bathroom floor due to syncope today. History of prostate cancer.  EXAM: CHEST  2 VIEW  COMPARISON:  06/03/2014.  FINDINGS: There  is a rounded mass in the right upper lung zone measuring approximately 3.2 x 3.1 cm. Stable enlarged cardiac silhouette and prominent interstitial markings. Diffuse osteopenia. Approximately 50% lower thoracic vertebral compression deformity.  IMPRESSION: 1. Approximately 3.2 cm mass in the right upper lung zone. This is concerning for a primary lung carcinoma. Further evaluation with a chest CT with contrast is recommended. 2. Stable cardiomegaly and mild chronic interstitial lung disease.  Electronically Signed   By: Claudie Revering M.D.   On: 08/11/2014 17:23   Ct Head Wo Contrast  08/11/2014   CLINICAL DATA:  From nursing home found unconsious on floor by nursing home staffPatient cant follow direction, coughing during the scan  EXAM: CT HEAD WITHOUT CONTRAST  CT CERVICAL SPINE WITHOUT CONTRAST  TECHNIQUE: Multidetector CT imaging of the head and cervical spine was performed following the standard protocol without intravenous contrast. Multiplanar CT image reconstructions of the cervical spine were also generated.  COMPARISON:  06/03/2014  FINDINGS: CT HEAD FINDINGS  Ventricles are normal in configuration. There is ventricular and sulcal enlargement reflecting age-appropriate atrophy. There are no parenchymal masses or mass effect. Patchy areas of white matter hypoattenuation noted consistent with mild chronic microvascular ischemic change.  There is no evidence of a recent transcortical infarct.  There are no extra-axial masses or abnormal fluid collections.  There is no intracranial hemorrhage.  Visualized sinuses and mastoid air cells are clear. No skull fracture.  CT CERVICAL SPINE FINDINGS  Study somewhat degraded by motion.  No fracture. Grade 1 anterolisthesis of C3 and C4, degenerative in origin. Moderate loss disc height at C3-C4 with mild loss disc height at C4-C5 through C6-C7. There are endplate osteophytes at these levels facet degenerative changes noted, greatest on the left at C3-C4. There is at  least mild central stenosis at C3-C4. There are varying degrees of neural foraminal narrowing. Bones are diffusely demineralized.  There are carotid vascular calcifications. No soft tissue masses or adenopathy.  Pleural parenchymal scarring is noted at the lung apices. Lung apices otherwise clear.  IMPRESSION: HEAD CT:  No acute intracranial abnormalities.  No skull fracture.  CERVICAL CT:  No fracture or acute finding.   Electronically Signed   By: Lajean Manes M.D.   On: 08/11/2014 17:54   Ct Cervical Spine Wo Contrast  08/11/2014   CLINICAL DATA:  From nursing home found unconsious on floor by nursing home staffPatient cant follow direction, coughing during the scan  EXAM: CT HEAD WITHOUT CONTRAST  CT CERVICAL SPINE WITHOUT CONTRAST  TECHNIQUE: Multidetector CT imaging of the head and cervical spine was performed following the standard protocol without intravenous contrast. Multiplanar CT image reconstructions of the cervical spine were also generated.  COMPARISON:  06/03/2014  FINDINGS: CT HEAD FINDINGS  Ventricles are normal in configuration. There is ventricular and sulcal enlargement reflecting age-appropriate atrophy. There are no parenchymal masses or mass effect. Patchy areas of white matter hypoattenuation noted consistent with mild chronic microvascular ischemic change.  There is no evidence of a recent transcortical infarct.  There are no extra-axial masses or abnormal fluid collections.  There is no intracranial hemorrhage.  Visualized sinuses and mastoid air cells are clear. No skull fracture.  CT CERVICAL SPINE FINDINGS  Study somewhat degraded by motion.  No fracture. Grade 1 anterolisthesis of C3 and C4, degenerative in origin. Moderate loss disc height at C3-C4 with mild loss disc height at C4-C5 through C6-C7. There are endplate osteophytes at these levels facet degenerative changes noted, greatest on the left at C3-C4. There is at least mild central stenosis at C3-C4. There are varying  degrees of neural foraminal narrowing. Bones are diffusely demineralized.  There are carotid vascular calcifications. No soft tissue masses or adenopathy.  Pleural parenchymal scarring is noted at the lung apices. Lung apices otherwise clear.  IMPRESSION: HEAD CT:  No acute intracranial abnormalities.  No skull fracture.  CERVICAL CT:  No fracture or acute finding.  Electronically Signed   By: Lajean Manes M.D.   On: 08/11/2014 17:54    EKG: Independently reviewed.  Assessment/Plan Principal Problem:   Hypoglycemia secondary to sulfonylurea Active Problems:   Diabetes mellitus, type 2   HTN (hypertension)   Hypothermia   Mass of lung   1. Hypoglycemia secondary to sulfonylurea - 1. Hold glyburide 2. Q2H BGL checks 3. Continue D5-NS started in ED 4. Repeat BMP in AM 2. RUL lung mass - 1. Worrisome for bronchogenic carcinoma 2. Checking chest CT with contrast 3. HTN - continue home meds 4. DM2 - carb mod diet, hypoglycemic so holding glyburide 5. Hypothermia - resolved at this point, now rewarmed to 97.    Code Status: Full code  Family Communication: No family in room Disposition Plan: Admit to obs   Time spent: 50 min  GARDNER, JARED M. [facility] Pager [phone]  If 7AM-7PM, please contact the day team taking care of the patient [email or web address] Password [email or web address] 08/11/2014, 7:21 PM

## 2014-08-11 NOTE — ED Notes (Signed)
cbg 85 '@16'$ :42

## 2014-08-11 NOTE — ED Provider Notes (Signed)
CSN: [id number]     Arrival date & time 08/11/14  1608 History   First MD Initiated Contact with Patient 08/11/14 1616     Chief Complaint  Patient presents with  . Hypoglycemia      Patient is a 81 y.o. male presenting with hypoglycemia. The history is provided by the nursing home, the EMS personnel and the patient. The history is limited by the condition of the patient (Hx dementia).  Hypoglycemia Pt was seen at 1615. Per EMS and NH report: Pt found on floor unresponsive by NH staff PTA. No reported apnea or pulselessness. Unknown when fall happened or for how long pt was laying on the floor. EMS noted CBG "33" on their arrival to scene. EMS gave IV D50 en route with CBG increasing to "174." Pt has significant hx of dementia and currently states he is "ok."   Past Medical History  Diagnosis Date  . Hypertension   . Prostate cancer   . Angiospasm   . Diabetes mellitus   . Schizoaffective disorder   . Dementia   . Cataracts, bilateral   . Blindness   . Rhabdomyolysis   . Gastritis   . UTI (lower urinary tract infection)   . GI bleed   . Anemia    Past Surgical History  Procedure Laterality Date  . Colonoscopy  08/08/2011    Procedure: COLONOSCOPY;  Surgeon: Lafayette Dragon, MD;  Location: [facility];  Service: Endoscopy;  Laterality: N/A;  Rm 1240  . Esophagogastroduodenoscopy  08/08/2011    Procedure: ESOPHAGOGASTRODUODENOSCOPY (EGD);  Surgeon: Lafayette Dragon, MD;  Location: [facility];  Service: Endoscopy;  Laterality: N/A;  . Abdominal surgery      patient does not remember what kind of surgery or why he had surgery    History  Substance Use Topics  . Smoking status: Current Every Day Smoker -- 70 years    Types: Cigarettes  . Smokeless tobacco: Former Systems developer  . Alcohol Use: No    Review of Systems  Unable to perform ROS: Dementia      Allergies  Hydrochlorothiazide  Home Medications   Prior to Admission medications   Medication Sig Start Date End Date Taking?  Authorizing Provider  acetaminophen (TYLENOL) 500 MG tablet Take 500 mg by mouth 3 (three) times daily.   Yes Historical Provider, MD  aspirin EC 81 MG tablet Take 81 mg by mouth every morning.   Yes Historical Provider, MD  buPROPion (WELLBUTRIN SR) 100 MG 12 hr tablet Take 100 mg by mouth every morning.    Yes Historical Provider, MD  cholecalciferol (VITAMIN D) 400 UNITS TABS tablet Take 800 Units by mouth every morning.    Yes Historical Provider, MD  Ferrous Fumarate 324 MG TABS Take 1 tablet by mouth every morning.    Yes Historical Provider, MD  finasteride (PROSCAR) 5 MG tablet Take 5 mg by mouth every morning. *Do not crush.*   Yes Historical Provider, MD  fluticasone (FLONASE) 50 MCG/ACT nasal spray Place 1 spray into the nose every morning.    Yes Historical Provider, MD  furosemide (LASIX) 20 MG tablet Take 1 tablet (20 mg total) by mouth daily. 06/05/14 06/05/15 Yes Thurnell Lose, MD  glyBURIDE (DIABETA) 5 MG tablet Take 5 mg by mouth daily with breakfast.   Yes Historical Provider, MD  hydrALAZINE (APRESOLINE) 25 MG tablet Take 1 tablet (25 mg total) by mouth 3 (three) times daily. 06/05/14  Yes Thurnell Lose, MD  hydrocerin Ree Kida)  CREA Apply 1 application topically 2 (two) times daily. To legs and feet. (May keep at bedside.)   Yes Historical Provider, MD  omeprazole (PRILOSEC) 40 MG capsule Take 40 mg by mouth every morning.   Yes Historical Provider, MD   BP 167/88 mmHg  Pulse 74  Temp(Src) 94.8 F (34.9 C) (Rectal)  Resp 16  SpO2 97% Physical Exam  1620: Physical examination:  Nursing notes reviewed; Vital signs and O2 SAT reviewed;  Constitutional: Well developed, Well nourished, In no acute distress; Head:  Normocephalic, atraumatic; Eyes: EOMI, PERRL, No scleral icterus; ENMT: Mouth and pharynx normal, Mucous membranes dry; Neck: Supple, Full range of motion, No lymphadenopathy; Cardiovascular: Regular rate and rhythm, No gallop; Respiratory: Breath sounds clear &  equal bilaterally, No wheezes.  Speaking full sentences with ease, Normal respiratory effort/excursion; Chest: Nontender, Movement normal; Abdomen: Soft, Nontender, Nondistended, Normal bowel sounds; Genitourinary: No CVA tenderness; Extremities: Pulses normal, No tenderness, +1 pedal edema bilat without calf asymmetry.; Neuro: Awake, alert, confused per hx dementia. Right eye cataract. No facial droop. Speech clear. Grips equal. Moves all extremities spontaneously and to command without apparent gross focal motor deficits..; Skin: Color normal, Warm, Dry.   ED Course  Procedures     EKG Interpretation   Date/Time:  Friday Aug 11 2014 16:18:49 EDT Ventricular Rate:  63 PR Interval:  275 QRS Duration: 167 QT Interval:  446 QTC Calculation: 457 R Axis:   66 Text Interpretation:  Sinus rhythm with 1st degree A-V block Multiple  ventricular premature complexes Right bundle branch block Baseline wander  Artifact Poor data quality in current ECG precludes serial comparison  Confirmed by [facility]  MD, Nunzio Cory [id number]) on 08/11/2014 4:57:41 PM      MDM  MDM Reviewed: previous chart, nursing note and vitals Reviewed previous: labs and ECG Interpretation: labs, ECG, x-ray and CT scan Total time providing critical care: 30-74 minutes. This excludes time spent performing separately reportable procedures and services. Consults: admitting MD   CRITICAL CARE Performed by: Alfonzo Feller Total critical care time: 35 Critical care time was exclusive of separately billable procedures and treating other patients. Critical care was necessary to treat or prevent imminent or life-threatening deterioration. Critical care was time spent personally by me on the following activities: development of treatment plan with patient and/or surrogate as well as nursing, discussions with consultants, evaluation of patient's response to treatment, examination of patient, obtaining history from patient or  surrogate, ordering and performing treatments and interventions, ordering and review of laboratory studies, ordering and review of radiographic studies, pulse oximetry and re-evaluation of patient's condition.    Results for orders placed or performed during the hospital encounter of 08/11/14  Comprehensive metabolic panel  Result Value Ref Range   Sodium 131 (L) 135 - 145 mmol/L   Potassium 4.1 3.5 - 5.1 mmol/L   Chloride 98 (L) 101 - 111 mmol/L   CO2 20 (L) 22 - 32 mmol/L   Glucose, Bld 52 (L) 70 - 99 mg/dL   BUN 14 6 - 20 mg/dL   Creatinine, Ser 0.75 0.61 - 1.24 mg/dL   Calcium 8.9 8.9 - 10.3 mg/dL   Total Protein 7.5 6.5 - 8.1 g/dL   Albumin 3.3 (L) 3.5 - 5.0 g/dL   AST 37 15 - 41 U/L   ALT 21 17 - 63 U/L   Alkaline Phosphatase 113 38 - 126 U/L   Total Bilirubin 0.4 0.3 - 1.2 mg/dL   GFR calc non Af Amer >60 >60  mL/min   GFR calc Af Amer >60 >60 mL/min   Anion gap 13 5 - 15  Troponin I  Result Value Ref Range   Troponin I <0.03 <0.031 ng/mL  CBC with Differential  Result Value Ref Range   WBC 11.5 (H) 4.0 - 10.5 K/uL   RBC 3.87 (L) 4.22 - 5.81 MIL/uL   Hemoglobin 11.9 (L) 13.0 - 17.0 g/dL   HCT 35.6 (L) 39.0 - 52.0 %   MCV 92.0 78.0 - 100.0 fL   MCH 30.7 26.0 - 34.0 pg   MCHC 33.4 30.0 - 36.0 g/dL   RDW 13.3 11.5 - 15.5 %   Platelets 312 150 - 400 K/uL   Neutrophils Relative % 90 (H) 43 - 77 %   Neutro Abs 10.3 (H) 1.7 - 7.7 K/uL   Lymphocytes Relative 3 (L) 12 - 46 %   Lymphs Abs 0.4 (L) 0.7 - 4.0 K/uL   Monocytes Relative 6 3 - 12 %   Monocytes Absolute 0.7 0.1 - 1.0 K/uL   Eosinophils Relative 0 0 - 5 %   Eosinophils Absolute 0.1 0.0 - 0.7 K/uL   Basophils Relative 1 0 - 1 %   Basophils Absolute 0.1 0.0 - 0.1 K/uL  Lactic acid, plasma  Result Value Ref Range   Lactic Acid, Venous 1.3 0.5 - 2.0 mmol/L  CK  Result Value Ref Range   Total CK 254 49 - 397 U/L  POC CBG, ED  Result Value Ref Range   Glucose-Capillary 85 70 - 99 mg/dL   Comment 1 Notify RN     Comment 2 Document in Chart   CBG monitoring, ED  Result Value Ref Range   Glucose-Capillary 48 (L) 70 - 99 mg/dL   Dg Chest 2 View 08/11/2014   CLINICAL DATA:  [place] on the bathroom floor due to syncope today. History of prostate cancer.  EXAM: CHEST  2 VIEW  COMPARISON:  06/03/2014.  FINDINGS: There is a rounded mass in the right upper lung zone measuring approximately 3.2 x 3.1 cm. Stable enlarged cardiac silhouette and prominent interstitial markings. Diffuse osteopenia. Approximately 50% lower thoracic vertebral compression deformity.  IMPRESSION: 1. Approximately 3.2 cm mass in the right upper lung zone. This is concerning for a primary lung carcinoma. Further evaluation with a chest CT with contrast is recommended. 2. Stable cardiomegaly and mild chronic interstitial lung disease.   Electronically Signed   By: Claudie Revering M.D.   On: 08/11/2014 17:23   Ct Head Wo Contrast 08/11/2014   CLINICAL DATA:  From nursing home found unconsious on floor by nursing home staffPatient cant follow direction, coughing during the scan  EXAM: CT HEAD WITHOUT CONTRAST  CT CERVICAL SPINE WITHOUT CONTRAST  TECHNIQUE: Multidetector CT imaging of the head and cervical spine was performed following the standard protocol without intravenous contrast. Multiplanar CT image reconstructions of the cervical spine were also generated.  COMPARISON:  06/03/2014  FINDINGS: CT HEAD FINDINGS  Ventricles are normal in configuration. There is ventricular and sulcal enlargement reflecting age-appropriate atrophy. There are no parenchymal masses or mass effect. Patchy areas of white matter hypoattenuation noted consistent with mild chronic microvascular ischemic change.  There is no evidence of a recent transcortical infarct.  There are no extra-axial masses or abnormal fluid collections.  There is no intracranial hemorrhage.  Visualized sinuses and mastoid air cells are clear. No skull fracture.  CT CERVICAL SPINE FINDINGS  Study somewhat  degraded by motion.  No fracture. Grade 1  anterolisthesis of C3 and C4, degenerative in origin. Moderate loss disc height at C3-C4 with mild loss disc height at C4-C5 through C6-C7. There are endplate osteophytes at these levels facet degenerative changes noted, greatest on the left at C3-C4. There is at least mild central stenosis at C3-C4. There are varying degrees of neural foraminal narrowing. Bones are diffusely demineralized.  There are carotid vascular calcifications. No soft tissue masses or adenopathy.  Pleural parenchymal scarring is noted at the lung apices. Lung apices otherwise clear.  IMPRESSION: HEAD CT:  No acute intracranial abnormalities.  No skull fracture.  CERVICAL CT:  No fracture or acute finding.   Electronically Signed   By: Lajean Manes M.D.   On: 08/11/2014 17:54   Ct Cervical Spine Wo Contrast 08/11/2014   CLINICAL DATA:  From nursing home found unconsious on floor by nursing home staffPatient cant follow direction, coughing during the scan  EXAM: CT HEAD WITHOUT CONTRAST  CT CERVICAL SPINE WITHOUT CONTRAST  TECHNIQUE: Multidetector CT imaging of the head and cervical spine was performed following the standard protocol without intravenous contrast. Multiplanar CT image reconstructions of the cervical spine were also generated.  COMPARISON:  06/03/2014  FINDINGS: CT HEAD FINDINGS  Ventricles are normal in configuration. There is ventricular and sulcal enlargement reflecting age-appropriate atrophy. There are no parenchymal masses or mass effect. Patchy areas of white matter hypoattenuation noted consistent with mild chronic microvascular ischemic change.  There is no evidence of a recent transcortical infarct.  There are no extra-axial masses or abnormal fluid collections.  There is no intracranial hemorrhage.  Visualized sinuses and mastoid air cells are clear. No skull fracture.  CT CERVICAL SPINE FINDINGS  Study somewhat degraded by motion.  No fracture. Grade 1 anterolisthesis of C3  and C4, degenerative in origin. Moderate loss disc height at C3-C4 with mild loss disc height at C4-C5 through C6-C7. There are endplate osteophytes at these levels facet degenerative changes noted, greatest on the left at C3-C4. There is at least mild central stenosis at C3-C4. There are varying degrees of neural foraminal narrowing. Bones are diffusely demineralized.  There are carotid vascular calcifications. No soft tissue masses or adenopathy.  Pleural parenchymal scarring is noted at the lung apices. Lung apices otherwise clear.  IMPRESSION: HEAD CT:  No acute intracranial abnormalities.  No skull fracture.  CERVICAL CT:  No fracture or acute finding.   Electronically Signed   By: Lajean Manes M.D.   On: 08/11/2014 17:54    1905:  Pt recurrently hypoglycemic on oral hypoglycemic (glyburide). Several doses of IV D50 given with good response. IV D5NS gtt started. Bear hugger placed for hypothermia. VS remain stable otherwise. T/C to [facility]. Alcario Drought, case discussed, including:  HPI, pertinent PM/SHx, VS/PE, dx testing, ED course and treatment:  Agreeable to admit, requests to write temporary orders, obtain stepdown bed to team MCAdmits.   Francine Graven, DO 08/13/14 1650

## 2014-08-12 ENCOUNTER — Encounter (HOSPITAL_COMMUNITY): Payer: Self-pay | Admitting: Radiology

## 2014-08-12 DIAGNOSIS — H269 Unspecified cataract: Secondary | ICD-10-CM | POA: Diagnosis present

## 2014-08-12 DIAGNOSIS — Z7982 Long term (current) use of aspirin: Secondary | ICD-10-CM | POA: Diagnosis not present

## 2014-08-12 DIAGNOSIS — T68XXXD Hypothermia, subsequent encounter: Secondary | ICD-10-CM

## 2014-08-12 DIAGNOSIS — I739 Peripheral vascular disease, unspecified: Secondary | ICD-10-CM | POA: Diagnosis present

## 2014-08-12 DIAGNOSIS — F259 Schizoaffective disorder, unspecified: Secondary | ICD-10-CM | POA: Diagnosis present

## 2014-08-12 DIAGNOSIS — D638 Anemia in other chronic diseases classified elsewhere: Secondary | ICD-10-CM | POA: Diagnosis present

## 2014-08-12 DIAGNOSIS — Z720 Tobacco use: Secondary | ICD-10-CM | POA: Diagnosis present

## 2014-08-12 DIAGNOSIS — C3490 Malignant neoplasm of unspecified part of unspecified bronchus or lung: Secondary | ICD-10-CM | POA: Diagnosis present

## 2014-08-12 DIAGNOSIS — I10 Essential (primary) hypertension: Secondary | ICD-10-CM | POA: Diagnosis present

## 2014-08-12 DIAGNOSIS — Z22322 Carrier or suspected carrier of Methicillin resistant Staphylococcus aureus: Secondary | ICD-10-CM

## 2014-08-12 DIAGNOSIS — E11649 Type 2 diabetes mellitus with hypoglycemia without coma: Secondary | ICD-10-CM | POA: Diagnosis present

## 2014-08-12 DIAGNOSIS — D649 Anemia, unspecified: Secondary | ICD-10-CM | POA: Diagnosis present

## 2014-08-12 DIAGNOSIS — R918 Other nonspecific abnormal finding of lung field: Secondary | ICD-10-CM

## 2014-08-12 DIAGNOSIS — E162 Hypoglycemia, unspecified: Secondary | ICD-10-CM | POA: Diagnosis present

## 2014-08-12 DIAGNOSIS — Z885 Allergy status to narcotic agent status: Secondary | ICD-10-CM | POA: Diagnosis not present

## 2014-08-12 DIAGNOSIS — T383X1D Poisoning by insulin and oral hypoglycemic [antidiabetic] drugs, accidental (unintentional), subsequent encounter: Secondary | ICD-10-CM

## 2014-08-12 DIAGNOSIS — T68XXXA Hypothermia, initial encounter: Secondary | ICD-10-CM | POA: Diagnosis present

## 2014-08-12 DIAGNOSIS — F0390 Unspecified dementia without behavioral disturbance: Secondary | ICD-10-CM | POA: Diagnosis present

## 2014-08-12 DIAGNOSIS — E222 Syndrome of inappropriate secretion of antidiuretic hormone: Secondary | ICD-10-CM | POA: Diagnosis present

## 2014-08-12 DIAGNOSIS — F17210 Nicotine dependence, cigarettes, uncomplicated: Secondary | ICD-10-CM | POA: Diagnosis present

## 2014-08-12 DIAGNOSIS — Z8546 Personal history of malignant neoplasm of prostate: Secondary | ICD-10-CM | POA: Diagnosis not present

## 2014-08-12 DIAGNOSIS — H540 Blindness, both eyes: Secondary | ICD-10-CM | POA: Diagnosis present

## 2014-08-12 DIAGNOSIS — Z7951 Long term (current) use of inhaled steroids: Secondary | ICD-10-CM | POA: Diagnosis not present

## 2014-08-12 HISTORY — DX: Carrier or suspected carrier of methicillin resistant Staphylococcus aureus: Z22.322

## 2014-08-12 LAB — GLUCOSE, CAPILLARY
GLUCOSE-CAPILLARY: 76 mg/dL (ref 70–99)
GLUCOSE-CAPILLARY: 69 mg/dL — AB (ref 70–99)
GLUCOSE-CAPILLARY: 90 mg/dL (ref 70–99)
Glucose-Capillary: 108 mg/dL — ABNORMAL HIGH (ref 70–99)
Glucose-Capillary: 140 mg/dL — ABNORMAL HIGH (ref 70–99)
Glucose-Capillary: 63 mg/dL — ABNORMAL LOW (ref 70–99)
Glucose-Capillary: 67 mg/dL — ABNORMAL LOW (ref 70–99)
Glucose-Capillary: 71 mg/dL (ref 70–99)
Glucose-Capillary: 72 mg/dL (ref 70–99)
Glucose-Capillary: 75 mg/dL (ref 70–99)
Glucose-Capillary: 81 mg/dL (ref 70–99)
Glucose-Capillary: 83 mg/dL (ref 70–99)

## 2014-08-12 LAB — BASIC METABOLIC PANEL
Anion gap: 9 (ref 5–15)
BUN: 9 mg/dL (ref 6–20)
CO2: 25 mmol/L (ref 22–32)
CREATININE: 0.75 mg/dL (ref 0.61–1.24)
Calcium: 8.7 mg/dL — ABNORMAL LOW (ref 8.9–10.3)
Chloride: 97 mmol/L — ABNORMAL LOW (ref 101–111)
GFR calc non Af Amer: >60 mL/min (ref >60)
Glucose, Bld: 63 mg/dL — ABNORMAL LOW (ref 70–99)
Potassium: 3.5 mmol/L (ref 3.5–5.1)
Sodium: 131 mmol/L — ABNORMAL LOW (ref 135–145)

## 2014-08-12 LAB — CBC
HEMATOCRIT: 32.9 % — AB (ref 39.0–52.0)
Hemoglobin: 10.9 g/dL — ABNORMAL LOW (ref 13.0–17.0)
MCH: 30.4 pg (ref 26.0–34.0)
MCHC: 33.1 g/dL (ref 30.0–36.0)
MCV: 91.9 fL (ref 78.0–100.0)
Platelets: 382 10*3/uL (ref 150–400)
RBC: 3.58 MIL/uL — ABNORMAL LOW (ref 4.22–5.81)
RDW: 13.4 % (ref 11.5–15.5)
WBC: 9.5 10*3/uL (ref 4.0–10.5)

## 2014-08-12 MED ORDER — MUPIROCIN 2 % EX OINT
TOPICAL_OINTMENT | Freq: Two times a day (BID) | CUTANEOUS | Status: DC
  Administered 2014-08-12 – 2014-08-13 (×3): via NASAL
  Filled 2014-08-12: qty 22

## 2014-08-12 MED ORDER — DEXTROSE 50 % IV SOLN
INTRAVENOUS | Status: AC
  Administered 2014-08-12: 50 mL via INTRAVENOUS
  Filled 2014-08-12: qty 50

## 2014-08-12 MED ORDER — DEXTROSE 50 % IV SOLN: 50.0000 mL | Freq: Once | INTRAVENOUS | Status: DC

## 2014-08-12 MED ORDER — IOHEXOL 300 MG/ML  SOLN
80.0000 mL | Freq: Once | INTRAMUSCULAR | Status: AC | PRN
  Administered 2014-08-12: 80 mL via INTRAVENOUS

## 2014-08-12 MED ORDER — GLUCOSE 40 % PO GEL
ORAL | Status: AC
  Filled 2014-08-12: qty 1

## 2014-08-12 MED ORDER — CHLORHEXIDINE GLUCONATE CLOTH 2 % EX PADS
6.0000 | MEDICATED_PAD | Freq: Every day | CUTANEOUS | Status: DC
  Administered 2014-08-12 – 2014-08-13 (×2): 6 via TOPICAL

## 2014-08-12 MED ORDER — NICOTINE 21 MG/24HR TD PT24
21.0000 mg | MEDICATED_PATCH | Freq: Every day | TRANSDERMAL | Status: DC
  Administered 2014-08-12 – 2014-08-13 (×2): 21 mg via TRANSDERMAL
  Filled 2014-08-12 (×2): qty 1

## 2014-08-12 MED ORDER — DEXTROSE 50 % IV SOLN
INTRAVENOUS | Status: AC
  Filled 2014-08-12: qty 50

## 2014-08-12 NOTE — Progress Notes (Signed)
Progress Note   Michael Yang CHE:[id number] DOB: 1923-04-13 DOA: 08/11/2014 PCP: Michael Poll, MD   Brief Narrative:   Michael Yang is an 81 y.o. male with a PMH of type 2 diabetes treated with glyburide who was found down on his bathroom floor at his SNF. Blood glucose was 33 and temperature was 94.8 on initial assessment.  Assessment/Plan:   Principal Problem:   Hypoglycemia secondary to sulfonylurea in a patient with type 2 diabetes - Hold oral hypoglycemics. - Continue to check CBGs every 2 hours until stable. - Continue dextrose infusion. CBGs 63-140.  Active Problems:   MRSA carrier - Contact precautions. - Decontamination therapy ordered.    Dementia - From SNF    Anemia of chronic disease - Hemoglobin stable.  Heme check stools.  H/O GIB secondary to gastritis.    Hyponatremia - Hydrate and follow sodium.  May be from lung mass.    Tobacco abuse - Nicotine patch ordered.    HTN (hypertension) - Currently being managed with Lasix and hydralazine. Blood pressure stable.    Hypothermia - Treated with a bair hugger. Currently resolved. - Serum lactate not elevated on admission, no evidence of sepsis. - Hypothermia likely related to hypoglycemia.    Mass of lung - Worrisome for bronchogenic carcinoma.  3. - Given age and comorbidities, likely is a poor candidate for chemotherapy. Burnis Medin get palliative care consultation prior to working up further.    DVT Prophylaxis - Continue subcutaneous heparin.  Code Status: Full. Family Communication: NOK Michael Yang & Michael Yang (sisters).  Michael Yang is also involved as a care taker.  Updated Michael Yang by telephone. Disposition Plan: From SNF.  D/C back in next 1-2 days if sugars stable and once palliative care team has addressed goals of care.   IV Access:    Peripheral IV   Procedures and diagnostic studies:   Dg Chest 2 View  08/11/2014   CLINICAL DATA:  [place] on the bathroom floor due to  syncope today. History of prostate cancer.  EXAM: CHEST  2 VIEW  COMPARISON:  06/03/2014.  FINDINGS: There is a rounded mass in the right upper lung zone measuring approximately 3.2 x 3.1 cm. Stable enlarged cardiac silhouette and prominent interstitial markings. Diffuse osteopenia. Approximately 50% lower thoracic vertebral compression deformity.  IMPRESSION: 1. Approximately 3.2 cm mass in the right upper lung zone. This is concerning for a primary lung carcinoma. Further evaluation with a chest CT with contrast is recommended. 2. Stable cardiomegaly and mild chronic interstitial lung disease.   Electronically Signed   By: Michael Yang M.D.   On: 08/11/2014 17:23   Ct Head Wo Contrast  08/11/2014   CLINICAL DATA:  From nursing home found unconsious on floor by nursing home staffPatient cant follow direction, coughing during the scan  EXAM: CT HEAD WITHOUT CONTRAST  CT CERVICAL SPINE WITHOUT CONTRAST  TECHNIQUE: Multidetector CT imaging of the head and cervical spine was performed following the standard protocol without intravenous contrast. Multiplanar CT image reconstructions of the cervical spine were also generated.  COMPARISON:  06/03/2014  FINDINGS: CT HEAD FINDINGS  Ventricles are normal in configuration. There is ventricular and sulcal enlargement reflecting age-appropriate atrophy. There are no parenchymal masses or mass effect. Patchy areas of white matter hypoattenuation noted consistent with mild chronic microvascular ischemic change.  There is no evidence of a recent transcortical infarct.  There are no extra-axial masses or abnormal fluid collections.  There is no intracranial hemorrhage.  Visualized sinuses and mastoid air cells are clear. No skull fracture.  CT CERVICAL SPINE FINDINGS  Study somewhat degraded by motion.  No fracture. Grade 1 anterolisthesis of C3 and C4, degenerative in origin. Moderate loss disc height at C3-C4 with mild loss disc height at C4-C5 through C6-C7. There are endplate  osteophytes at these levels facet degenerative changes noted, greatest on the left at C3-C4. There is at least mild central stenosis at C3-C4. There are varying degrees of neural foraminal narrowing. Bones are diffusely demineralized.  There are carotid vascular calcifications. No soft tissue masses or adenopathy.  Pleural parenchymal scarring is noted at the lung apices. Lung apices otherwise clear.  IMPRESSION: HEAD CT:  No acute intracranial abnormalities.  No skull fracture.  CERVICAL CT:  No fracture or acute finding.   Electronically Signed   By: Michael Yang M.D.   On: 08/11/2014 17:54   Ct Chest W Contrast  08/12/2014   CLINICAL DATA:  Lung mass.  EXAM: CT CHEST WITH CONTRAST  TECHNIQUE: Multidetector CT imaging of the chest was performed during intravenous contrast administration.  CONTRAST:  6m OMNIPAQUE IOHEXOL 300 MG/ML  SOLN  COMPARISON:  Chest radiographs 08/11/2014  FINDINGS: There is exaggeration of the thoracic kyphosis with the neck in a flexed position, partially limiting evaluation of the lower neck/ supraclavicular regions. No enlarged axillary, mediastinal, or hilar lymph nodes are identified. LAD and right coronary artery calcification is noted as well as moderate thoracic aortic calcification. There is a trace right pleural effusion.  As seen on chest radiographs, there is a 3.1 x 3.0 cm right upper lobe mass with spiculated margins. Centrilobular and paraseptal emphysema is noted. Subsegmental atelectasis is present in the lower lobes and lingula. Major airways are patent.  Visualized portion of the upper abdomen demonstrates advanced atherosclerotic calcification of the abdominal aorta and its major branch vessels. There is mild thickening of the right adrenal gland without a discrete nodule identified allowing for mild motion artifact through this region. There is a 2.9 cm low-density lesion arising from the upper pole of the left kidney which cannot be confirmed as a cyst based on  its attenuation. Left adrenal calcification is noted. There is a moderate L2 vertebral compression fracture without evidence of significant paravertebral hematoma or associated soft tissue mass.  IMPRESSION: 1. 3.1 cm right upper lobe the lung mass, concerning for primary bronchogenic carcinoma. No enlarged lymph nodes. 2. Emphysema. 3. Indeterminate 2.9 cm left upper pole renal lesion. This could be further evaluated with nonemergent abdominal MRI or renal mass protocol CT. 4. L2 vertebral compression fracture, possibly chronic. Correlate for localized pain in this area.   Electronically Signed   By: ALogan Bores  On: 08/12/2014 01:43   Ct Cervical Spine Wo Contrast  08/11/2014   CLINICAL DATA:  From nursing home found unconsious on floor by nursing home staffPatient cant follow direction, coughing during the scan  EXAM: CT HEAD WITHOUT CONTRAST  CT CERVICAL SPINE WITHOUT CONTRAST  TECHNIQUE: Multidetector CT imaging of the head and cervical spine was performed following the standard protocol without intravenous contrast. Multiplanar CT image reconstructions of the cervical spine were also generated.  COMPARISON:  06/03/2014  FINDINGS: CT HEAD FINDINGS  Ventricles are normal in configuration. There is ventricular and sulcal enlargement reflecting age-appropriate atrophy. There are no parenchymal masses or mass effect. Patchy areas of white matter hypoattenuation noted consistent with mild chronic microvascular ischemic change.  There is no evidence of a recent transcortical infarct.  There are no extra-axial masses or abnormal fluid collections.  There is no intracranial hemorrhage.  Visualized sinuses and mastoid air cells are clear. No skull fracture.  CT CERVICAL SPINE FINDINGS  Study somewhat degraded by motion.  No fracture. Grade 1 anterolisthesis of C3 and C4, degenerative in origin. Moderate loss disc height at C3-C4 with mild loss disc height at C4-C5 through C6-C7. There are endplate osteophytes at  these levels facet degenerative changes noted, greatest on the left at C3-C4. There is at least mild central stenosis at C3-C4. There are varying degrees of neural foraminal narrowing. Bones are diffusely demineralized.  There are carotid vascular calcifications. No soft tissue masses or adenopathy.  Pleural parenchymal scarring is noted at the lung apices. Lung apices otherwise clear.  IMPRESSION: HEAD CT:  No acute intracranial abnormalities.  No skull fracture.  CERVICAL CT:  No fracture or acute finding.   Electronically Signed   By: Michael Yang M.D.   On: 08/11/2014 17:54     Medical Consultants:    Palliative Care  Anti-Infectives:    None.  Subjective:   Michael Yang is a bit restless, confused.  Attempting to get out of chair despite instructions to not get up without assistance.  RN Energy manager.  No complaints of dyspnea or pain.  Objective:    Filed Vitals:   08/11/14 1918 08/11/14 1930 08/11/14 2018 08/12/14 0522  BP:  163/72 166/82 144/76  Pulse:  84 79 77  Temp: 97 F (36.1 C)  97.6 F (36.4 C) 98.2 F (36.8 C)  TempSrc: Rectal  Oral Oral  Resp:  '24 20 18  '$ SpO2:  100% 100% 98%   No intake or output data in the 24 hours ending 08/12/14 0853  Exam: Gen:  NAD, restless, mildly confused. Cardiovascular:  RRR, No M/R/G Respiratory:  Lungs CTAB Gastrointestinal:  Abdomen soft, NT/ND, + BS Extremities:  1+ edema BLE   Data Reviewed:    Labs: Basic Metabolic Panel:  Recent Labs Lab 08/11/14 1800  NA 131*  K 4.1  CL 98*  CO2 20*  GLUCOSE 52*  BUN 14  CREATININE 0.75  CALCIUM 8.9   GFR CrCl cannot be calculated (Unknown ideal weight.). Liver Function Tests:  Recent Labs Lab 08/11/14 1800  AST 37  ALT 21  ALKPHOS 113  BILITOT 0.4  PROT 7.5  ALBUMIN 3.3*   CBC:  Recent Labs Lab 08/11/14 1800 08/12/14 0653  WBC 11.5* 9.5  NEUTROABS 10.3*  --   HGB 11.9* 10.9*  HCT 35.6* 32.9*  MCV 92.0 91.9  PLT 312 382   Cardiac  Enzymes:  Recent Labs Lab 08/11/14 1800  CKTOTAL 254  TROPONINI <0.03   BNP (last 3 results) No results for input(s): PROBNP in the last 8760 hours. CBG:  Recent Labs Lab 08/12/14 0003 08/12/14 0219 08/12/14 0419 08/12/14 0457 08/12/14 0638  GLUCAP 83 76 63* 140* 69*   Sepsis Labs:  Recent Labs Lab 08/11/14 1800 08/11/14 1850 08/12/14 0653  WBC 11.5*  --  9.5  LATICACIDVEN 1.3 1.0  --    Microbiology Recent Results (from the past 240 hour(s))  MRSA PCR Screening     Status: Abnormal   Collection Time: 08/11/14  9:09 PM  Result Value Ref Range Status   MRSA by PCR POSITIVE (A) NEGATIVE Final    Comment:        The GeneXpert MRSA Assay (FDA approved for NASAL specimens only), is one component of a comprehensive MRSA colonization surveillance  program. It is not intended to diagnose MRSA infection nor to guide or monitor treatment for MRSA infections. RESULT CALLED TO, READ BACK BY AND VERIFIED WITH: TROGDON,M RN [id number] AT 2335 SKEEN,P      Medications:   . acetaminophen  500 mg Oral TID  . aspirin EC  81 mg Oral q morning - 10a  . buPROPion  100 mg Oral BH-q7a  . cholecalciferol  800 Units Oral BH-q7a  . dextrose  50 mL Intravenous Once  . dextrose      . ferrous fumarate  1 tablet Oral Daily  . finasteride  5 mg Oral BH-q7a  . fluticasone  1 spray Each Nare BH-q7a  . furosemide  20 mg Oral Daily  . heparin  5,000 Units Subcutaneous 3 times per day  . hydrALAZINE  25 mg Oral TID  . hydrocerin  1 application Topical BID  . pantoprazole  80 mg Oral Daily   Continuous Infusions: . dextrose 5 % and 0.9% NaCl 50 mL/hr at 08/11/14 2018    Time spent: 35 minutes.  The patient is medically complex and requires high complexity decision making & coordination of care with Palliative Care/family.      [facility] Pager [phone]. If unable to reach me by pager, please call my cell phone at [phone].  *Please refer to [email or web address],  password [email or web address] to get updated schedule on who will round on this patient, as hospitalists switch teams weekly. If 7PM-7AM, please contact night-coverage at [email or web address], password [email or web address] for any overnight needs.  08/12/2014, 8:53 AM

## 2014-08-12 NOTE — Progress Notes (Signed)
Patient is a q2hr blood sugar, blood sugar steadily drops.  Patient  Has received 2 amps of D50 tonight.  Will continue to monitor.  Wilson Singer, RN

## 2014-08-12 NOTE — Progress Notes (Signed)
UR completed 

## 2014-08-12 NOTE — Progress Notes (Signed)
Patient arrived to the floor via stretcher from the emergency room.  Alert to self only, history of dementia, DM, blindness in right eye, prostate cancer, bilateral cataracts.  Nasal swab completed and patient positive for MRSA.  IV in RAC with D51/2ns '@75'$ .  Admitted for hypoglycemia, found unconscious in nursing home with a BS of 33.  Q2 blood sugars ordered .  Skin intact, will continue to monitor.  Wilson Singer, RN

## 2014-08-12 NOTE — Progress Notes (Signed)
Hypoglycemic Event  CBG: 63  Treatment: D50 IV 50 mL  Symptoms: None  Follow-up CBG: [id number] CBG Result:140  Possible Reasons for Event: Inadequate meal intake  Comments/MD notified:n/a    Roxan Diesel  Remember to initiate Hypoglycemia Order Set & complete

## 2014-08-13 DIAGNOSIS — E871 Hypo-osmolality and hyponatremia: Secondary | ICD-10-CM

## 2014-08-13 LAB — BASIC METABOLIC PANEL
Anion gap: 7 (ref 5–15)
BUN: 7 mg/dL (ref 6–20)
CO2: 25 mmol/L (ref 22–32)
CREATININE: 0.81 mg/dL (ref 0.61–1.24)
Calcium: 8.6 mg/dL — ABNORMAL LOW (ref 8.9–10.3)
Chloride: 96 mmol/L — ABNORMAL LOW (ref 101–111)
GFR calc Af Amer: >60 mL/min (ref >60)
GFR calc non Af Amer: >60 mL/min (ref >60)
Glucose, Bld: 116 mg/dL — ABNORMAL HIGH (ref 70–99)
Potassium: 3.6 mmol/L (ref 3.5–5.1)
Sodium: 128 mmol/L — ABNORMAL LOW (ref 135–145)

## 2014-08-13 LAB — CBC
HCT: 32.4 % — ABNORMAL LOW (ref 39.0–52.0)
HEMOGLOBIN: 10.6 g/dL — AB (ref 13.0–17.0)
MCH: 29.9 pg (ref 26.0–34.0)
MCHC: 32.7 g/dL (ref 30.0–36.0)
MCV: 91.5 fL (ref 78.0–100.0)
Platelets: 367 10*3/uL (ref 150–400)
RBC: 3.54 MIL/uL — ABNORMAL LOW (ref 4.22–5.81)
RDW: 13.4 % (ref 11.5–15.5)
WBC: 7.4 10*3/uL (ref 4.0–10.5)

## 2014-08-13 LAB — GLUCOSE, CAPILLARY
GLUCOSE-CAPILLARY: 124 mg/dL — AB (ref 70–99)
GLUCOSE-CAPILLARY: 102 mg/dL — AB (ref 70–99)
Glucose-Capillary: 120 mg/dL — ABNORMAL HIGH (ref 70–99)
Glucose-Capillary: 71 mg/dL (ref 70–99)
Glucose-Capillary: 88 mg/dL (ref 70–99)

## 2014-08-13 NOTE — Progress Notes (Signed)
Pt prepared for d/c to SNF. IV d/c'd. Skin intact except as most recently charted. Vitals are stable. Report called to receiving facility. Pt to be transported by ambulance service. 

## 2014-08-13 NOTE — Clinical Social Work Note (Signed)
Clinical Social Work Assessment  Patient Details  Name: Michael Yang MRN: [id number] Date of Birth: 1923-12-15  Date of referral:  08/13/14               Reason for consult:  Facility Placement                Permission sought to share information with:  Facility Sport and exercise psychologist, Family Supports Permission granted to share information::  Yes, Verbal Permission Granted  Name::     Web designer::  [facility]  Relationship::  RN  Contact Information:  [phone]  Housing/Transportation Living arrangements for the past 2 months:  [place] of Information:  Medical Team, Facility, Other (Comment Required) Patient Interpreter Needed:  None Criminal Activity/Legal Involvement Pertinent to Current Situation/Hospitalization:  No - Comment as needed Significant Relationships:  Siblings Lives with:  Facility Resident Do you feel safe going back to the place where you live?  Yes Need for family participation in patient care:  Yes (Comment)  Care giving concerns: None; patient will return to [facility] where he will continue to receive care and support.    Social Worker assessment / plan:  Patient was admitted for observation. CSW contacted facility in order to prepare transition back. CSW sent necessary paperwork and prepared discharge packet for patient to return to facility.   Employment status:  Disabled (Comment on whether or not currently receiving Disability) Insurance information:  Medicare PT Recommendations:  Not assessed at this time Information / Referral to community resources:  Other (Comment Required)  Patient/Family's Response to care: CSW spoke with patient's sister who was agreeable with patient returning to facility. Sister was supportive of patient's care in that setting.   Patient/Family's Understanding of and Emotional Response to Diagnosis, Current Treatment, and Prognosis:  Patient unclear, but fmaily agreeable.   Emotional  Assessment Appearance:  Appears stated age Attitude/Demeanor/Rapport:   (wnl) Affect (typically observed):  Appropriate, Pleasant Orientation:  Oriented to Self Alcohol / Substance use:  Never Used Psych involvement (Current and /or in the community):  No (Comment)  Discharge Needs  Concerns to be addressed:    Readmission within the last 30 days:  No Current discharge risk:  None Barriers to Discharge:  No Barriers Identified   Christene Lye, LCSW 08/13/2014, 3:45 PM

## 2014-08-13 NOTE — Discharge Summary (Signed)
Physician Discharge Summary  Michael Yang GEX:[id number] DOB: 1923/11/10 DOA: 08/11/2014  PCP: Reymundo Poll, MD  Admit date: 08/11/2014 Discharge date: 08/13/2014   Recommendations for Outpatient Follow-Up:   1. The patient should have an outpatient palliative care consultation given findings on chest CT of probable bronchogenic carcinoma.  He likely is a poor candidate for surgery or chemotherapy given his co-morbidities.  His PCP can help with decision making regarding whether further work up is warranted. 2. Please check CBGs Q AC and HS.  Notify PCP for any recurrent hypoglycemia or hyperglycemia. 3. Liberalize diet.   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  Diet recommendation: Regular.   History of Present Illness:   Michael Yang is an 81 y.o. male with a PMH of type 2 diabetes treated with glyburide who was found down on his bathroom floor at his SNF. Blood glucose was 33 and temperature was 94.8 on initial assessment.  Hospital Course by Problem:   Principal Problem:  Hypoglycemia secondary to sulfonylurea in a patient with type 2 diabetes - Resolved off oral hypoglycemics and with dextrose infusion. CBGs 71-120. - Liberalize diet. - Check CBGs Q AC/HS.  Notify MD for recurrent lows, or persistent highs.  Active Problems:  MRSA carrier - Contact precautions maintained. - Decontamination therapy provided.   Dementia - From SNF.  To return.   Anemia of chronic disease - Hemoglobin stable.   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   Tobacco abuse - Nicotine patch ordered.   HTN (hypertension) - Currently being managed with Lasix and hydralazine. Blood pressure stable.   Hypothermia - Treated with a bair hugger. Currently resolved. - Serum lactate not elevated on admission, no evidence of sepsis. - Hypothermia likely related to hypoglycemia.   Mass of lung - Worrisome for bronchogenic carcinoma.  - Given age and comorbidities, likely is a poor candidate for chemotherapy or surgery. - Recommend outpatient consultation with PCP and palliative care team.   Medical Consultants:    None.   Discharge Exam:   Filed Vitals:   08/13/14 0605  BP: 155/54  Pulse: 82  Temp: 98.2 F (36.8 C)  Resp: 17   Filed Vitals:   08/12/14 1503 08/12/14 1653 08/12/14 2133 08/13/14 0605  BP: 157/78 140/76 140/55 155/54  Pulse: 53  63 82  Temp: 97.5 F (36.4 C)  98.3 F (36.8 C) 98.2 F (36.8 C)  TempSrc:   Oral Oral  Resp: '18  18 17  '$ SpO2: 100%  96% 97%    Gen:  NAD Cardiovascular:  RRR, No M/R/G Respiratory: Lungs CTAB Gastrointestinal: Abdomen soft, NT/ND with normal active bowel sounds. Extremities: No C/E/C   The results of significant diagnostics from this hospitalization (including imaging, microbiology, ancillary and laboratory) are listed below for reference.     Procedures and Diagnostic Studies:   Dg Chest 2 View  08/11/2014   CLINICAL DATA:  [place] on the bathroom floor due to syncope today. History of prostate cancer.  EXAM: CHEST  2 VIEW  COMPARISON:  06/03/2014.  FINDINGS: There is a rounded mass in the right upper lung zone measuring approximately 3.2 x 3.1 cm. Stable enlarged cardiac silhouette and prominent interstitial markings. Diffuse osteopenia. Approximately 50% lower thoracic vertebral compression deformity.  IMPRESSION: 1. Approximately 3.2 cm mass in the right upper lung zone. This is concerning for a primary lung carcinoma. Further evaluation with a chest CT with contrast is  recommended. 2. Stable cardiomegaly and mild chronic interstitial lung disease.   Electronically Signed   By: Claudie Revering M.D.   On: 08/11/2014 17:23   Ct Head Wo Contrast  08/11/2014   CLINICAL DATA:  From nursing home found unconsious on floor by nursing home staffPatient cant follow direction, coughing during the scan  EXAM: CT HEAD WITHOUT CONTRAST  CT CERVICAL SPINE WITHOUT CONTRAST  TECHNIQUE: Multidetector CT imaging of the head and cervical spine was performed following the standard protocol without intravenous contrast. Multiplanar CT image reconstructions of the cervical spine were also generated.  COMPARISON:  06/03/2014  FINDINGS: CT HEAD FINDINGS  Ventricles are normal in configuration. There is ventricular and sulcal enlargement reflecting age-appropriate atrophy. There are no parenchymal masses or mass effect. Patchy areas of white matter hypoattenuation noted consistent with mild chronic microvascular ischemic change.  There is no evidence of a recent transcortical infarct.  There are no extra-axial masses or abnormal fluid collections.  There is no intracranial hemorrhage.  Visualized sinuses and mastoid air cells are clear. No skull fracture.  CT CERVICAL SPINE FINDINGS  Study somewhat degraded by motion.  No fracture. Grade 1 anterolisthesis of C3 and C4, degenerative in origin. Moderate loss disc height at C3-C4 with mild loss disc height at C4-C5 through C6-C7. There are endplate osteophytes at these levels facet degenerative changes noted, greatest on the left at C3-C4. There is at least mild central stenosis at C3-C4. There are varying degrees of neural foraminal narrowing. Bones are diffusely demineralized.  There are carotid vascular calcifications. No soft tissue masses or adenopathy.  Pleural parenchymal scarring is noted at the lung apices. Lung apices otherwise clear.  IMPRESSION: HEAD CT:  No acute intracranial abnormalities.  No skull fracture.  CERVICAL CT:  No fracture or acute  finding.   Electronically Signed   By: Lajean Manes M.D.   On: 08/11/2014 17:54   Ct Chest W Contrast  08/12/2014   CLINICAL DATA:  Lung mass.  EXAM: CT CHEST WITH CONTRAST  TECHNIQUE: Multidetector CT imaging of the chest was performed during intravenous contrast administration.  CONTRAST:  36m OMNIPAQUE IOHEXOL 300 MG/ML  SOLN  COMPARISON:  Chest radiographs 08/11/2014  FINDINGS: There is exaggeration of the thoracic kyphosis with the neck in a flexed position, partially limiting evaluation of the lower neck/ supraclavicular regions. No enlarged axillary, mediastinal, or hilar lymph nodes are identified. LAD and right coronary artery calcification is noted as well as moderate thoracic aortic calcification. There is a trace right pleural effusion.  As seen on chest radiographs, there is a 3.1 x 3.0 cm right upper lobe mass with spiculated margins. Centrilobular and paraseptal emphysema is noted. Subsegmental atelectasis is present in the lower lobes and lingula. Major airways are patent.  Visualized portion of the upper abdomen demonstrates advanced atherosclerotic calcification of the abdominal aorta and its major branch vessels. There is mild thickening of the right adrenal gland without a discrete nodule identified allowing for mild motion artifact through this region. There is a 2.9 cm low-density lesion arising from the upper pole of the left kidney  which cannot be confirmed as a cyst based on its attenuation. Left adrenal calcification is noted. There is a moderate L2 vertebral compression fracture without evidence of significant paravertebral hematoma or associated soft tissue mass.  IMPRESSION: 1. 3.1 cm right upper lobe the lung mass, concerning for primary bronchogenic carcinoma. No enlarged lymph nodes. 2. Emphysema. 3. Indeterminate 2.9 cm left upper pole renal lesion. This could be further evaluated with nonemergent abdominal MRI or renal mass protocol CT. 4. L2 vertebral compression fracture,  possibly chronic. Correlate for localized pain in this area.   Electronically Signed   By: Logan Bores   On: 08/12/2014 01:43   Ct Cervical Spine Wo Contrast  08/11/2014   CLINICAL DATA:  From nursing home found unconsious on floor by nursing home staffPatient cant follow direction, coughing during the scan  EXAM: CT HEAD WITHOUT CONTRAST  CT CERVICAL SPINE WITHOUT CONTRAST  TECHNIQUE: Multidetector CT imaging of the head and cervical spine was performed following the standard protocol without intravenous contrast. Multiplanar CT image reconstructions of the cervical spine were also generated.  COMPARISON:  06/03/2014  FINDINGS: CT HEAD FINDINGS  Ventricles are normal in configuration. There is ventricular and sulcal enlargement reflecting age-appropriate atrophy. There are no parenchymal masses or mass effect. Patchy areas of white matter hypoattenuation noted consistent with mild chronic microvascular ischemic change.  There is no evidence of a recent transcortical infarct.  There are no extra-axial masses or abnormal fluid collections.  There is no intracranial hemorrhage.  Visualized sinuses and mastoid air cells are clear. No skull fracture.  CT CERVICAL SPINE FINDINGS  Study somewhat degraded by motion.  No fracture. Grade 1 anterolisthesis of C3 and C4, degenerative in origin. Moderate loss disc height at C3-C4 with mild loss disc height at C4-C5 through C6-C7. There are endplate osteophytes at these levels facet degenerative changes noted, greatest on the left at C3-C4. There is at least mild central stenosis at C3-C4. There are varying degrees of neural foraminal narrowing. Bones are diffusely demineralized.  There are carotid vascular calcifications. No soft tissue masses or adenopathy.  Pleural parenchymal scarring is noted at the lung apices. Lung apices otherwise clear.  IMPRESSION: HEAD CT:  No acute intracranial abnormalities.  No skull fracture.  CERVICAL CT:  No fracture or acute finding.    Electronically Signed   By: Lajean Manes M.D.   On: 08/11/2014 17:54     Labs:   Basic Metabolic Panel:  Recent Labs Lab 08/11/14 1800 08/12/14 0653 08/13/14 0555  NA 131* 131* 128*  K 4.1 3.5 3.6  CL 98* 97* 96*  CO2 20* 25 25  GLUCOSE 52* 63* 116*  BUN '14 9 7  '$ CREATININE 0.75 0.75 0.81  CALCIUM 8.9 8.7* 8.6*   GFR CrCl cannot be calculated (Unknown ideal weight.). Liver Function Tests:  Recent Labs Lab 08/11/14 1800  AST 37  ALT 21  ALKPHOS 113  BILITOT 0.4  PROT 7.5  ALBUMIN 3.3*   CBC:  Recent Labs Lab 08/11/14 1800 08/12/14 0653 08/13/14 0555  WBC 11.5* 9.5 7.4  NEUTROABS 10.3*  --   --   HGB 11.9* 10.9* 10.6*  HCT 35.6* 32.9* 32.4*  MCV 92.0 91.9 91.5  PLT 312 382 367   Cardiac Enzymes:  Recent Labs Lab 08/11/14 1800  CKTOTAL 254  TROPONINI <0.03   CBG:  Recent Labs Lab 08/12/14 2007 08/12/14 2216 08/13/14 08/13/14 0215 08/13/14 0406  GLUCAP 75 90 120* 88 71   Microbiology Recent Results (from  the past 240 hour(s))  Urine culture     Status: None (Preliminary result)   Collection Time: 08/11/14  6:50 PM  Result Value Ref Range Status   Specimen Description URINE, CLEAN CATCH  Final   Special Requests NONE  Final   Colony Count   Final    >=100,000 COLONIES/ML Performed at [facility]    Culture   Final    [place] Performed at [facility]    Report Status PENDING  Incomplete  MRSA PCR Screening     Status: Abnormal   Collection Time: 08/11/14  9:09 PM  Result Value Ref Range Status   MRSA by PCR POSITIVE (A) NEGATIVE Final    Comment:        The GeneXpert MRSA Assay (FDA approved for NASAL specimens only), is one component of a comprehensive MRSA colonization surveillance program. It is not intended to diagnose MRSA infection nor to guide or monitor treatment for MRSA infections. RESULT CALLED TO, READ BACK BY AND VERIFIED WITH: TROGDON,M RN [id number] AT 2335 SKEEN,P      Discharge  Instructions:   Discharge Instructions    Call MD for:    Complete by:  As directed   Recurrent hypoglycemia or hyperglycemia     Diet general    Complete by:  As directed      Increase activity slowly    Complete by:  As directed      Walk with assistance    Complete by:  As directed      Walker     Complete by:  As directed             Medication List    STOP taking these medications        glyBURIDE 5 MG tablet  Commonly known as:  DIABETA      TAKE these medications        acetaminophen 500 MG tablet  Commonly known as:  TYLENOL  Take 500 mg by mouth 3 (three) times daily.     aspirin EC 81 MG tablet  Take 81 mg by mouth every morning.     buPROPion 100 MG 12 hr tablet  Commonly known as:  WELLBUTRIN SR  Take 100 mg by mouth every morning.     cholecalciferol 400 UNITS Tabs tablet  Commonly known as:  VITAMIN D  Take 800 Units by mouth every morning.     Ferrous Fumarate 324 MG Tabs  Take 1 tablet by mouth every morning.     finasteride 5 MG tablet  Commonly known as:  PROSCAR  Take 5 mg by mouth every morning. *Do not crush.*     fluticasone 50 MCG/ACT nasal spray  Commonly known as:  FLONASE  Place 1 spray into the nose every morning.     furosemide 20 MG tablet  Commonly known as:  LASIX  Take 1 tablet (20 mg total) by mouth daily.     hydrALAZINE 25 MG tablet  Commonly known as:  APRESOLINE  Take 1 tablet (25 mg total) by mouth 3 (three) times daily.     hydrocerin Crea  Apply 1 application topically 2 (two) times daily. To legs and feet. (May keep at bedside.)     omeprazole 40 MG capsule  Commonly known as:  PRILOSEC  Take 40 mg by mouth every morning.           Follow-up Information    Follow up with Reymundo Poll, MD. Schedule  an appointment as soon as possible for a visit in 1 week.   Specialty:  Family Medicine   Why:  To follow up on abnormal CT chest/mass, and to assess blood glucoses.   Contact information:   [address] [place] [address] [phone]        Time coordinating discharge: 35 minutes with > 50% of time discussing current diagnostic test results, clinical impression and plan of care.   Signed:  Manisha Cancel  Pager [phone] [facility] 08/13/2014, 10:00 AM

## 2014-08-14 LAB — GLUCOSE, CAPILLARY
GLUCOSE-CAPILLARY: 110 mg/dL — AB (ref 70–99)
GLUCOSE-CAPILLARY: 91 mg/dL (ref 70–99)

## 2014-08-14 LAB — URINE CULTURE: Colony Count: >=100000

## 2014-11-02 ENCOUNTER — Emergency Department (HOSPITAL_COMMUNITY): Payer: Medicare Other

## 2014-11-02 ENCOUNTER — Encounter (HOSPITAL_COMMUNITY): Payer: Self-pay | Admitting: Emergency Medicine

## 2014-11-02 ENCOUNTER — Emergency Department (HOSPITAL_COMMUNITY)
Admission: EM | Admit: 2014-11-02 | Discharge: 2014-11-02 | Disposition: A | Discharge status: Home / Self Care | Payer: Medicare Other | Attending: Emergency Medicine | Admitting: Emergency Medicine

## 2014-11-02 DIAGNOSIS — Z7982 Long term (current) use of aspirin: Secondary | ICD-10-CM | POA: Insufficient documentation

## 2014-11-02 DIAGNOSIS — M6282 Rhabdomyolysis: Secondary | ICD-10-CM | POA: Diagnosis not present

## 2014-11-02 DIAGNOSIS — S42031A Displaced fracture of lateral end of right clavicle, initial encounter for closed fracture: Secondary | ICD-10-CM | POA: Diagnosis not present

## 2014-11-02 DIAGNOSIS — S42001A Fracture of unspecified part of right clavicle, initial encounter for closed fracture: Secondary | ICD-10-CM

## 2014-11-02 DIAGNOSIS — H269 Unspecified cataract: Secondary | ICD-10-CM | POA: Diagnosis not present

## 2014-11-02 DIAGNOSIS — Y92128 Other place in nursing home as the place of occurrence of the external cause: Secondary | ICD-10-CM | POA: Diagnosis not present

## 2014-11-02 DIAGNOSIS — E11649 Type 2 diabetes mellitus with hypoglycemia without coma: Secondary | ICD-10-CM | POA: Diagnosis not present

## 2014-11-02 DIAGNOSIS — S2241XA Multiple fractures of ribs, right side, initial encounter for closed fracture: Secondary | ICD-10-CM | POA: Insufficient documentation

## 2014-11-02 DIAGNOSIS — Z8546 Personal history of malignant neoplasm of prostate: Secondary | ICD-10-CM | POA: Diagnosis not present

## 2014-11-02 DIAGNOSIS — Z8709 Personal history of other diseases of the respiratory system: Secondary | ICD-10-CM | POA: Insufficient documentation

## 2014-11-02 DIAGNOSIS — Z87440 Personal history of urinary (tract) infections: Secondary | ICD-10-CM | POA: Insufficient documentation

## 2014-11-02 DIAGNOSIS — W19XXXA Unspecified fall, initial encounter: Secondary | ICD-10-CM

## 2014-11-02 DIAGNOSIS — H540 Blindness, both eyes: Secondary | ICD-10-CM | POA: Insufficient documentation

## 2014-11-02 DIAGNOSIS — W1839XA Other fall on same level, initial encounter: Secondary | ICD-10-CM | POA: Insufficient documentation

## 2014-11-02 DIAGNOSIS — I10 Essential (primary) hypertension: Secondary | ICD-10-CM | POA: Diagnosis not present

## 2014-11-02 DIAGNOSIS — S299XXA Unspecified injury of thorax, initial encounter: Secondary | ICD-10-CM | POA: Diagnosis present

## 2014-11-02 DIAGNOSIS — Z8719 Personal history of other diseases of the digestive system: Secondary | ICD-10-CM | POA: Diagnosis not present

## 2014-11-02 DIAGNOSIS — E162 Hypoglycemia, unspecified: Secondary | ICD-10-CM

## 2014-11-02 DIAGNOSIS — F0390 Unspecified dementia without behavioral disturbance: Secondary | ICD-10-CM | POA: Diagnosis not present

## 2014-11-02 DIAGNOSIS — S2231XA Fracture of one rib, right side, initial encounter for closed fracture: Secondary | ICD-10-CM

## 2014-11-02 DIAGNOSIS — Y999 Unspecified external cause status: Secondary | ICD-10-CM | POA: Insufficient documentation

## 2014-11-02 DIAGNOSIS — Z720 Tobacco use: Secondary | ICD-10-CM | POA: Diagnosis not present

## 2014-11-02 DIAGNOSIS — Z862 Personal history of diseases of the blood and blood-forming organs and certain disorders involving the immune mechanism: Secondary | ICD-10-CM | POA: Insufficient documentation

## 2014-11-02 DIAGNOSIS — Y9389 Activity, other specified: Secondary | ICD-10-CM | POA: Diagnosis not present

## 2014-11-02 LAB — URINALYSIS, ROUTINE W REFLEX MICROSCOPIC
BILIRUBIN URINE: NEGATIVE
Glucose, UA: NEGATIVE mg/dL
HGB URINE DIPSTICK: NEGATIVE
KETONES UR: NEGATIVE mg/dL
Nitrite: POSITIVE — AB
PH: 6 (ref 5.0–8.0)
PROTEIN: NEGATIVE mg/dL
SPECIFIC GRAVITY, URINE: 1.013 (ref 1.005–1.030)
UROBILINOGEN UA: 0.2 mg/dL (ref 0.0–1.0)

## 2014-11-02 LAB — CBC WITH DIFFERENTIAL/PLATELET
Basophils Absolute: 0 10*3/uL (ref 0.0–0.1)
Basophils Relative: 0 % (ref 0–1)
EOS PCT: 2 % (ref 0–5)
Eosinophils Absolute: 0.2 10*3/uL (ref 0.0–0.7)
HCT: 34.8 % — ABNORMAL LOW (ref 39.0–52.0)
HEMOGLOBIN: 10.9 g/dL — AB (ref 13.0–17.0)
Lymphocytes Relative: 10 % — ABNORMAL LOW (ref 12–46)
Lymphs Abs: 0.8 10*3/uL (ref 0.7–4.0)
MCH: 29.4 pg (ref 26.0–34.0)
MCHC: 31.3 g/dL (ref 30.0–36.0)
MCV: 93.8 fL (ref 78.0–100.0)
Monocytes Absolute: 0.6 10*3/uL (ref 0.1–1.0)
Monocytes Relative: 8 % (ref 3–12)
Neutro Abs: 6.5 10*3/uL (ref 1.7–7.7)
Neutrophils Relative %: 80 % — ABNORMAL HIGH (ref 43–77)
Platelets: 308 10*3/uL (ref 150–400)
RBC: 3.71 MIL/uL — AB (ref 4.22–5.81)
RDW: 15.7 % — ABNORMAL HIGH (ref 11.5–15.5)
WBC: 8.1 10*3/uL (ref 4.0–10.5)

## 2014-11-02 LAB — CBG MONITORING, ED
GLUCOSE-CAPILLARY: 47 mg/dL — AB (ref 65–99)
GLUCOSE-CAPILLARY: 83 mg/dL (ref 65–99)
Glucose-Capillary: 111 mg/dL — ABNORMAL HIGH (ref 65–99)

## 2014-11-02 LAB — BASIC METABOLIC PANEL
Anion gap: 7 (ref 5–15)
BUN: 20 mg/dL (ref 6–20)
CHLORIDE: 103 mmol/L (ref 101–111)
CO2: 28 mmol/L (ref 22–32)
Calcium: 9.2 mg/dL (ref 8.9–10.3)
Creatinine, Ser: 0.91 mg/dL (ref 0.61–1.24)
GFR calc Af Amer: >60 mL/min (ref >60)
GLUCOSE: 62 mg/dL — AB (ref 65–99)
Potassium: 3.8 mmol/L (ref 3.5–5.1)
SODIUM: 138 mmol/L (ref 135–145)

## 2014-11-02 LAB — URINE MICROSCOPIC-ADD ON

## 2014-11-02 MED ORDER — ACETAMINOPHEN ER 650 MG PO TBCR: 650.0000 mg | EXTENDED_RELEASE_TABLET | Freq: Three times a day (TID) | ORAL | Status: AC | PRN

## 2014-11-02 MED ORDER — DEXTROSE 50 % IV SOLN: 1.0000 | Freq: Once | INTRAVENOUS | Status: DC

## 2014-11-02 NOTE — ED Notes (Signed)
Per EMS-fell down hill this am-no LOC, complaining of left wrist pain, has chronic B/L wrist pain-no change from mental baseline per facility

## 2014-11-02 NOTE — ED Notes (Signed)
Off floor for testing 

## 2014-11-02 NOTE — ED Provider Notes (Signed)
CSN: [id number]     Arrival date & time 11/02/14  1443 History   First MD Initiated Contact with Patient 11/02/14 [phone]     Chief Complaint  Patient presents with  . Fall     (Consider location/radiation/quality/duration/timing/severity/associated sxs/prior Treatment) HPI Comments: Patient is an 81 year old male with a past medical history of hypertension, dementia, diabetes, schizoaffective disorder, lung mass of RUL, and blindness who presents via EMS from [facility] after a fall that occurred prior to arrival. I spoke with Unice Cobble from the nursing home who states the patient went out to the lawn in his wheelchair to smoke a cigarette and then needed to urinate. He got up from the wheelchair and walked over to the grass to urinate when he fell to the ground and rolled down the hill and landed in a bed of rocks. Patient states he hit his head on a rock. Unknown LOC. He also complains of bilateral wrist pain. No other injury. No aggravating/alleviating factors.    Past Medical History  Diagnosis Date  . Hypertension   . Angiospasm   . Diabetes mellitus   . Schizoaffective disorder   . Dementia   . Cataracts, bilateral   . Blindness   . Rhabdomyolysis   . Gastritis   . UTI (lower urinary tract infection)   . GI bleed   . Anemia   . Mass of lung 08/11/2014    Mass of upper lobe of right lung   . Syncope 06/03/2014  . 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 Prostate cancer    Past Surgical History  Procedure Laterality Date  . Colonoscopy  08/08/2011    Procedure: COLONOSCOPY;  Surgeon: Lafayette Dragon, MD;  Location: [facility];  Service: Endoscopy;  Laterality: N/A;  Rm 1240  . Esophagogastroduodenoscopy  08/08/2011    Procedure: ESOPHAGOGASTRODUODENOSCOPY (EGD);  Surgeon: Lafayette Dragon, MD;  Location: [facility];  Service: Endoscopy;  Laterality: N/A;  . Abdominal surgery      patient does not remember what kind of surgery or why he had surgery   No family history on file. History   Substance Use Topics  . Smoking status: Current Every Day Smoker -- 70 years    Types: Cigarettes  . Smokeless tobacco: Former Systems developer  . Alcohol Use: No    Review of Systems  Constitutional: Negative for fever, chills and fatigue.  HENT: Negative for trouble swallowing.   Eyes: Negative for visual disturbance.  Respiratory: Negative for shortness of breath.   Cardiovascular: Negative for chest pain and palpitations.  Gastrointestinal: Negative for nausea, vomiting, abdominal pain and diarrhea.  Genitourinary: Negative for dysuria and difficulty urinating.  Musculoskeletal: Positive for arthralgias. Negative for neck pain.  Skin: Negative for color change.  Neurological: Negative for dizziness and weakness.  Psychiatric/Behavioral: Negative for dysphoric mood.      Allergies  Hydrochlorothiazide  Home Medications   Prior to Admission medications   Medication Sig Start Date End Date Taking? Authorizing Provider  acetaminophen (TYLENOL) 500 MG tablet Take 500 mg by mouth 3 (three) times daily.    Historical Provider, MD  aspirin EC 81 MG tablet Take 81 mg by mouth every morning.    Historical Provider, MD  buPROPion (WELLBUTRIN SR) 100 MG 12 hr tablet Take 100 mg by mouth every morning.     Historical Provider, MD  cholecalciferol (VITAMIN D) 400 UNITS TABS tablet Take 800 Units by mouth every morning.     Historical Provider, MD  Ferrous  Fumarate 324 MG TABS Take 1 tablet by mouth every morning.     Historical Provider, MD  finasteride (PROSCAR) 5 MG tablet Take 5 mg by mouth every morning. *Do not crush.*    Historical Provider, MD  fluticasone (FLONASE) 50 MCG/ACT nasal spray Place 1 spray into the nose every morning.     Historical Provider, MD  furosemide (LASIX) 20 MG tablet Take 1 tablet (20 mg total) by mouth daily. Patient taking differently: Take 20 mg by mouth every morning.  06/05/14 06/05/15  Thurnell Lose, MD  hydrALAZINE (APRESOLINE) 25 MG tablet Take 1 tablet (25  mg total) by mouth 3 (three) times daily. 06/05/14   Thurnell Lose, MD  hydrocerin (EUCERIN) CREA Apply 1 application topically 2 (two) times daily. To legs and feet. (May keep at bedside.)    Historical Provider, MD  omeprazole (PRILOSEC) 40 MG capsule Take 40 mg by mouth every morning.    Historical Provider, MD   BP 171/68 mmHg  Pulse 65  Temp(Src) 97.5 F (36.4 C) (Oral)  Resp 16  SpO2 98% Physical Exam  Constitutional: He is oriented to person, place, and time. He appears well-developed and well-nourished. No distress.  HENT:  Head: Normocephalic and atraumatic.  Eyes: Conjunctivae and EOM are normal.  Neck: Normal range of motion.  Cardiovascular: Normal rate and regular rhythm.  Exam reveals no gallop and no friction rub.   No murmur heard. Pulmonary/Chest: Effort normal and breath sounds normal. He has no wheezes. He has no rales. He exhibits no tenderness.  Abdominal: Soft. He exhibits no distension. There is no tenderness. There is no rebound.  Musculoskeletal: Normal range of motion.  Stable pelvis. Full ROM of bilateral hips without deformity. No midline spine tenderness to palpation. Generalized tenderness to palpation of bilateral wrist.   Neurological: He is alert and oriented to person, place, and time. Coordination normal.  Speech is goal-oriented. Moves limbs without ataxia.   Skin: Skin is warm and dry.  No wound noted.   Psychiatric: He has a normal mood and affect. His behavior is normal.  Nursing note and vitals reviewed.   ED Course  Procedures (including critical care time) Labs Review Labs Reviewed - No data to display  Imaging Review Dg Chest 2 View  11/02/2014   CLINICAL DATA:  Fall.  EXAM: CHEST  2 VIEW  COMPARISON:  CT scan of Aug 12, 2014; radiograph of Aug 11, 2014.  FINDINGS: Stable cardiomediastinal silhouette. Continued presence of large right upper lobe mass is noted concerning for malignancy. No pneumothorax is noted. Minimal subsegmental  atelectasis is noted in both lung bases. Small right pleural effusion is noted. Bony thorax is intact.  IMPRESSION: Continued presence large right upper lobe mass is noted concerning for malignancy. Minimal bibasilar subsegmental atelectasis is noted. Small right pleural effusion.   Electronically Signed   By: Marijo Conception, M.D.   On: 11/02/2014 10:00   Dg Pelvis 1-2 Views  11/02/2014   CLINICAL DATA:  Pain following fall  EXAM: PELVIS - 1-2 VIEW  COMPARISON:  None.  FINDINGS: There is no evidence of pelvic fracture or dislocation. There is mild narrowing of both hip joints. No erosive change.  IMPRESSION: No fracture or dislocation.  Narrowing both hip joints, mild.   Electronically Signed   By: Lowella Grip III M.D.   On: 11/02/2014 09:58   Dg Shoulder Right  11/02/2014   CLINICAL DATA:  Acute right shoulder pain after fall today. Initial encounter.  EXAM: RIGHT SHOULDER - 2+ VIEW  COMPARISON:  None.  FINDINGS: Moderately displaced fractures are seen involving the lateral portion of the right fourth and fifth ribs. Severely displaced fracture of distal right clavicle is noted with overlapping fracture fragments. These appear closed and posttraumatic. Proximal humerus and scapula appear normal. No dislocation of glenohumeral joint is noted. Mass is noted in right upper lobe concerning for neoplasm or malignancy.  IMPRESSION: Moderately displaced right fourth and fifth rib fractures. Severely displaced distal right clavicular fracture is noted.  Right upper lobe mass is noted concerning for neoplasm or malignancy.   Electronically Signed   By: Marijo Conception, M.D.   On: 11/02/2014 11:04   Dg Wrist Complete Left  11/02/2014   CLINICAL DATA:  Fall from wheelchair this morning with bilateral wrist pain, initial encounter  EXAM: LEFT WRIST - COMPLETE 3+ VIEW  COMPARISON:  None.  FINDINGS: Mild degenerative changes are noted in the radiocarpal joint. Mild osteopenia is noted. No soft tissue swelling is  seen. No acute fracture or dislocation is noted.  IMPRESSION: Mild degenerative changes without acute abnormality.   Electronically Signed   By: Inez Catalina M.D.   On: 11/02/2014 09:26   Dg Wrist Complete Right  11/02/2014   CLINICAL DATA:  Fall from wheelchair this morning with bilateral wrist pain  EXAM: RIGHT WRIST - COMPLETE 3+ VIEW  COMPARISON:  None.  FINDINGS: Degenerative changes are noted at the first [facility] joint as well as in the radiocarpal joint. There is a vague lucency identified in the midportion of the scaphoid bone although no definitive cortical abnormality is seen. This is likely projectional in nature.  IMPRESSION: Degenerative changes.  No definitive fracture is seen.   Electronically Signed   By: Inez Catalina M.D.   On: 11/02/2014 09:27   Ct Head Wo Contrast  11/02/2014   CLINICAL DATA:  Pain following fall.  History of dementia  EXAM: CT HEAD WITHOUT CONTRAST  CT CERVICAL SPINE WITHOUT CONTRAST  TECHNIQUE: Multidetector CT imaging of the head and cervical spine was performed following the standard protocol without intravenous contrast. Multiplanar CT image reconstructions of the cervical spine were also generated.  COMPARISON:  CT head and cervical spine Aug 11, 2014  FINDINGS: CT HEAD FINDINGS  Mild diffuse atrophy is stable. There is no intracranial mass, hemorrhage, extra-axial fluid collection, or midline shift. There is mild patchy small vessel disease in the centra semiovale bilaterally. Elsewhere gray-white compartments appear normal. No demonstrable acute infarct. Bony calvarium appears intact. The mastoid air cells are clear.  CT CERVICAL SPINE FINDINGS  There is no demonstrable cervical spine fracture. There is again noted 3 mm of anterolisthesis of C3 on C4, felt to be due to underlying spondylosis. There is no new spondylolisthesis. Prevertebral soft tissues and predental space regions are normal. There is severe disc space narrowing at C3-4. There is moderate narrowing at  C4-5, C5-6, and C6-7. There are anterior osteophytes at C3, C4, C5, C6, C7, and T1. There is facet hypertrophy at essentially all levels bilaterally. There is exit foraminal narrowing due to bony hypertrophy at C4-5 and C5-6 bilaterally and at C6-7 on the left. Pannus posterior to the odontoid is stable. No narrowing at the craniocervical junction. There is central stenosis at C3-4 due to bony hypertrophy and spondylolisthesis. No disc extrusion appreciable. There are scattered foci of carotid artery calcification bilaterally.  Thyroid appears unremarkable. There is a small bulla in the medial left apex.  There is evidence  of an apparent comminuted fracture of the right acromion, incompletely visualized.  IMPRESSION: CT head: Mild atrophy with mild patchy periventricular small vessel disease. No intracranial mass, hemorrhage, or extra-axial fluid collection. No acute infarct evident.  CT cervical spine: No demonstrable cervical spine fracture. Stable spondylolisthesis at C3-4, felt to be due to underlying spondylosis. No new spondylolisthesis. Extensive multilevel osteoarthritic change, essentially stable. Moderate stenosis C3-4 due to the spondylolisthesis and bony hypertrophy. This finding is stable compared to prior study. There are foci of carotid artery calcification bilaterally.  There is evidence of a fracture along the medial right acromion, incompletely visualized. Advise right shoulder radiographs to further evaluate this region.   Electronically Signed   By: Lowella Grip III M.D.   On: 11/02/2014 09:57   Ct Cervical Spine Wo Contrast  11/02/2014   CLINICAL DATA:  Pain following fall.  History of dementia  EXAM: CT HEAD WITHOUT CONTRAST  CT CERVICAL SPINE WITHOUT CONTRAST  TECHNIQUE: Multidetector CT imaging of the head and cervical spine was performed following the standard protocol without intravenous contrast. Multiplanar CT image reconstructions of the cervical spine were also generated.   COMPARISON:  CT head and cervical spine Aug 11, 2014  FINDINGS: CT HEAD FINDINGS  Mild diffuse atrophy is stable. There is no intracranial mass, hemorrhage, extra-axial fluid collection, or midline shift. There is mild patchy small vessel disease in the centra semiovale bilaterally. Elsewhere gray-white compartments appear normal. No demonstrable acute infarct. Bony calvarium appears intact. The mastoid air cells are clear.  CT CERVICAL SPINE FINDINGS  There is no demonstrable cervical spine fracture. There is again noted 3 mm of anterolisthesis of C3 on C4, felt to be due to underlying spondylosis. There is no new spondylolisthesis. Prevertebral soft tissues and predental space regions are normal. There is severe disc space narrowing at C3-4. There is moderate narrowing at C4-5, C5-6, and C6-7. There are anterior osteophytes at C3, C4, C5, C6, C7, and T1. There is facet hypertrophy at essentially all levels bilaterally. There is exit foraminal narrowing due to bony hypertrophy at C4-5 and C5-6 bilaterally and at C6-7 on the left. Pannus posterior to the odontoid is stable. No narrowing at the craniocervical junction. There is central stenosis at C3-4 due to bony hypertrophy and spondylolisthesis. No disc extrusion appreciable. There are scattered foci of carotid artery calcification bilaterally.  Thyroid appears unremarkable. There is a small bulla in the medial left apex.  There is evidence of an apparent comminuted fracture of the right acromion, incompletely visualized.  IMPRESSION: CT head: Mild atrophy with mild patchy periventricular small vessel disease. No intracranial mass, hemorrhage, or extra-axial fluid collection. No acute infarct evident.  CT cervical spine: No demonstrable cervical spine fracture. Stable spondylolisthesis at C3-4, felt to be due to underlying spondylosis. No new spondylolisthesis. Extensive multilevel osteoarthritic change, essentially stable. Moderate stenosis C3-4 due to the  spondylolisthesis and bony hypertrophy. This finding is stable compared to prior study. There are foci of carotid artery calcification bilaterally.  There is evidence of a fracture along the medial right acromion, incompletely visualized. Advise right shoulder radiographs to further evaluate this region.   Electronically Signed   By: Lowella Grip III M.D.   On: 11/02/2014 09:57     EKG Interpretation None      MDM   Final diagnoses:  Fall  Right rib fracture, closed, initial encounter  Hypoglycemia  Right clavicle fracture, closed, initial encounter    9:22 AM CT head and cervical spine pending. Xrays  of both wrists pending.   11:53 AM Additional imaging added after hearing the history from nursing home staff. Patient's images show right 4th and 5th rib fractures and right clavicle fracture which is new since 2 months ago. Patient is nontender over anterior chest or right clavicle. Remaining imaging unremarkable for acute changes. Patient found to be hypoglycemic here at 47 which improved with OJ to 87. Patient will have a tray.   1:13 PM  I spoke with the radiologist about the right shoulder image with the clavicle fracture which is new from 2 months ago but not felt to be new today. Patient will have Orthopedic referral. CBG will be checked. Patient will be discharged with tylenol for pain as needed. Patient will have incentive spirometry for rib fracture.   Alvina Chou, PA-C 11/02/14 [place], MD 11/02/14 [phone]

## 2014-11-02 NOTE — ED Provider Notes (Signed)
Level V caveat patient demented. Brought by EMS. Reportedly fell this morning ground floor he tried to walk. He is wheelchair-bound. He fell and rolled down a hill. He offers no specific complaint. He reportedly complained of wrist pain to EMS. On exam patient is alert, speech is incomprehensible. Does not follow simple commands moves all extremities HEENT exam normocephalic atraumatic poor dentition neck supple trachea midline, no tenderness chest clear to auscultation chest back and abdomen without contusion abrasion or tenderness pelvis stable nontender all 4 extremities no contusion abrasion or tenderness neurovascularly intact  Orlie Dakin, MD 11/02/14 1047

## 2014-11-02 NOTE — ED Notes (Signed)
Bed: WA14 Expected date:  Expected time:  Means of arrival:  Comments: fall 

## 2014-11-02 NOTE — ED Notes (Signed)
2 240 cc cups of OJ given with 2 packs of sugar-will repeat CBG-per PA, ok to hold off on giving dextrose until repeat CBG

## 2014-11-02 NOTE — Discharge Instructions (Signed)
Take tylenol as needed for pain. Follow up with the recommended Orthopedic surgeon as needed. Refer to attached documents for more information.

## 2015-04-22 ENCOUNTER — Emergency Department (HOSPITAL_COMMUNITY): Payer: Medicare Other

## 2015-04-22 ENCOUNTER — Encounter (HOSPITAL_COMMUNITY): Payer: Self-pay | Admitting: *Deleted

## 2015-04-22 ENCOUNTER — Inpatient Hospital Stay (HOSPITAL_COMMUNITY)
Admission: EM | Admit: 2015-04-22 | Discharge: 2015-05-09 | DRG: 871 | Disposition: E | Payer: Medicare Other | Attending: Internal Medicine | Admitting: Internal Medicine

## 2015-04-22 DIAGNOSIS — W010XXA Fall on same level from slipping, tripping and stumbling without subsequent striking against object, initial encounter: Secondary | ICD-10-CM | POA: Diagnosis present

## 2015-04-22 DIAGNOSIS — S72114A Nondisplaced fracture of greater trochanter of right femur, initial encounter for closed fracture: Secondary | ICD-10-CM

## 2015-04-22 DIAGNOSIS — Y95 Nosocomial condition: Secondary | ICD-10-CM | POA: Diagnosis present

## 2015-04-22 DIAGNOSIS — H269 Unspecified cataract: Secondary | ICD-10-CM | POA: Diagnosis present

## 2015-04-22 DIAGNOSIS — I4510 Unspecified right bundle-branch block: Secondary | ICD-10-CM | POA: Diagnosis present

## 2015-04-22 DIAGNOSIS — S72001A Fracture of unspecified part of neck of right femur, initial encounter for closed fracture: Secondary | ICD-10-CM | POA: Diagnosis present

## 2015-04-22 DIAGNOSIS — Z22322 Carrier or suspected carrier of Methicillin resistant Staphylococcus aureus: Secondary | ICD-10-CM | POA: Diagnosis not present

## 2015-04-22 DIAGNOSIS — Y92129 Unspecified place in nursing home as the place of occurrence of the external cause: Secondary | ICD-10-CM

## 2015-04-22 DIAGNOSIS — Y9301 Activity, walking, marching and hiking: Secondary | ICD-10-CM | POA: Diagnosis present

## 2015-04-22 DIAGNOSIS — K219 Gastro-esophageal reflux disease without esophagitis: Secondary | ICD-10-CM | POA: Diagnosis present

## 2015-04-22 DIAGNOSIS — Z8546 Personal history of malignant neoplasm of prostate: Secondary | ICD-10-CM

## 2015-04-22 DIAGNOSIS — A419 Sepsis, unspecified organism: Secondary | ICD-10-CM | POA: Diagnosis not present

## 2015-04-22 DIAGNOSIS — Z888 Allergy status to other drugs, medicaments and biological substances status: Secondary | ICD-10-CM | POA: Diagnosis not present

## 2015-04-22 DIAGNOSIS — R6521 Severe sepsis with septic shock: Secondary | ICD-10-CM | POA: Diagnosis present

## 2015-04-22 DIAGNOSIS — J189 Pneumonia, unspecified organism: Secondary | ICD-10-CM | POA: Diagnosis present

## 2015-04-22 DIAGNOSIS — M25561 Pain in right knee: Secondary | ICD-10-CM | POA: Diagnosis present

## 2015-04-22 DIAGNOSIS — E119 Type 2 diabetes mellitus without complications: Secondary | ICD-10-CM | POA: Diagnosis present

## 2015-04-22 DIAGNOSIS — J9691 Respiratory failure, unspecified with hypoxia: Secondary | ICD-10-CM | POA: Diagnosis present

## 2015-04-22 DIAGNOSIS — J984 Other disorders of lung: Secondary | ICD-10-CM

## 2015-04-22 DIAGNOSIS — I739 Peripheral vascular disease, unspecified: Secondary | ICD-10-CM | POA: Diagnosis present

## 2015-04-22 DIAGNOSIS — H540 Blindness, both eyes: Secondary | ICD-10-CM | POA: Diagnosis present

## 2015-04-22 DIAGNOSIS — F259 Schizoaffective disorder, unspecified: Secondary | ICD-10-CM | POA: Diagnosis present

## 2015-04-22 DIAGNOSIS — Z66 Do not resuscitate: Secondary | ICD-10-CM | POA: Diagnosis present

## 2015-04-22 DIAGNOSIS — J9692 Respiratory failure, unspecified with hypercapnia: Secondary | ICD-10-CM | POA: Diagnosis present

## 2015-04-22 DIAGNOSIS — F0390 Unspecified dementia without behavioral disturbance: Secondary | ICD-10-CM | POA: Diagnosis not present

## 2015-04-22 DIAGNOSIS — Z79899 Other long term (current) drug therapy: Secondary | ICD-10-CM

## 2015-04-22 DIAGNOSIS — R918 Other nonspecific abnormal finding of lung field: Secondary | ICD-10-CM | POA: Diagnosis present

## 2015-04-22 DIAGNOSIS — I5032 Chronic diastolic (congestive) heart failure: Secondary | ICD-10-CM | POA: Diagnosis present

## 2015-04-22 DIAGNOSIS — I10 Essential (primary) hypertension: Secondary | ICD-10-CM | POA: Diagnosis present

## 2015-04-22 DIAGNOSIS — Z720 Tobacco use: Secondary | ICD-10-CM | POA: Diagnosis present

## 2015-04-22 DIAGNOSIS — F17210 Nicotine dependence, cigarettes, uncomplicated: Secondary | ICD-10-CM | POA: Diagnosis present

## 2015-04-22 DIAGNOSIS — I441 Atrioventricular block, second degree: Secondary | ICD-10-CM | POA: Diagnosis present

## 2015-04-22 DIAGNOSIS — Z7982 Long term (current) use of aspirin: Secondary | ICD-10-CM

## 2015-04-22 DIAGNOSIS — R0602 Shortness of breath: Secondary | ICD-10-CM | POA: Diagnosis not present

## 2015-04-22 HISTORY — DX: Chronic diastolic (congestive) heart failure: I50.32

## 2015-04-22 LAB — COMPREHENSIVE METABOLIC PANEL
ALK PHOS: 131 U/L — AB (ref 38–126)
ALT: 29 U/L (ref 17–63)
AST: 32 U/L (ref 15–41)
Albumin: 3.5 g/dL (ref 3.5–5.0)
Anion gap: 10 (ref 5–15)
BILIRUBIN TOTAL: 0.4 mg/dL (ref 0.3–1.2)
BUN: 22 mg/dL — ABNORMAL HIGH (ref 6–20)
CALCIUM: 9.8 mg/dL (ref 8.9–10.3)
CO2: 25 mmol/L (ref 22–32)
CREATININE: 1.06 mg/dL (ref 0.61–1.24)
Chloride: 104 mmol/L (ref 101–111)
GFR calc Af Amer: >60 mL/min (ref >60)
Glucose, Bld: 109 mg/dL — ABNORMAL HIGH (ref 65–99)
Potassium: 4 mmol/L (ref 3.5–5.1)
SODIUM: 139 mmol/L (ref 135–145)
Total Protein: 7.5 g/dL (ref 6.5–8.1)

## 2015-04-22 LAB — BRAIN NATRIURETIC PEPTIDE: B Natriuretic Peptide: 54.9 pg/mL (ref 0.0–100.0)

## 2015-04-22 LAB — CBC
HEMATOCRIT: 43.6 % (ref 39.0–52.0)
HEMOGLOBIN: 13.8 g/dL (ref 13.0–17.0)
MCH: 31.2 pg (ref 26.0–34.0)
MCHC: 31.7 g/dL (ref 30.0–36.0)
MCV: 98.4 fL (ref 78.0–100.0)
Platelets: 294 10*3/uL (ref 150–400)
RBC: 4.43 MIL/uL (ref 4.22–5.81)
RDW: 13.9 % (ref 11.5–15.5)
WBC: 16.8 10*3/uL — ABNORMAL HIGH (ref 4.0–10.5)

## 2015-04-22 LAB — APTT: APTT: 35 s (ref 24–37)

## 2015-04-22 LAB — PROTIME-INR
INR: 1.14 (ref 0.00–1.49)
Prothrombin Time: 14.8 seconds (ref 11.6–15.2)

## 2015-04-22 LAB — I-STAT TROPONIN, ED: Troponin i, poc: 0 ng/mL (ref 0.00–0.08)

## 2015-04-22 MED ORDER — SODIUM CHLORIDE 0.9 % IV SOLN
1000.0000 mL | INTRAVENOUS | Status: DC
  Administered 2015-04-22: 1000 mL via INTRAVENOUS

## 2015-04-22 MED ORDER — MORPHINE SULFATE (PF) 2 MG/ML IV SOLN
2.0000 mg | INTRAVENOUS | Status: DC | PRN
  Administered 2015-04-22: 2 mg via INTRAVENOUS
  Filled 2015-04-22: qty 1

## 2015-04-22 MED ORDER — IPRATROPIUM-ALBUTEROL 0.5-2.5 (3) MG/3ML IN SOLN
3.0000 mL | Freq: Once | RESPIRATORY_TRACT | Status: AC
  Administered 2015-04-22: 3 mL via RESPIRATORY_TRACT
  Filled 2015-04-22: qty 3

## 2015-04-22 MED ORDER — DEXTROSE 5 % IV SOLN
1.0000 g | Freq: Once | INTRAVENOUS | Status: AC
  Administered 2015-04-23: 1 g via INTRAVENOUS
  Filled 2015-04-22: qty 1

## 2015-04-22 MED ORDER — SODIUM CHLORIDE 0.9 % IV SOLN
500.0000 mg | Freq: Two times a day (BID) | INTRAVENOUS | Status: DC
  Administered 2015-04-23: 500 mg via INTRAVENOUS
  Filled 2015-04-22 (×2): qty 500

## 2015-04-22 NOTE — ED Notes (Signed)
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

## 2015-04-22 NOTE — ED Notes (Signed)
Bed: WA06 Expected date:  Expected time:  Means of arrival:  Comments: EMS 82yo M knee pain / fall

## 2015-04-22 NOTE — ED Notes (Signed)
Below order not completed by EW. 

## 2015-04-22 NOTE — H&P (Addendum)
[facility] History and Physical  Michael Yang PPI:[id number] DOB: Mar 18, 1924 DOA: 04/08/2015  Referring physician: ED physician PCP: Reymundo Poll, MD  Specialists:   Chief Complaint: Right knee pain after fall, cough, shortness of breath  HPI: Michael Yang is a 82 y.o. male with PMH of dementia, poor hearing, poor vision, hypertension, angioedema, GERD, depression, schizophrenia, GI bleeding, right upper lobe lung mass, prostate cancer, right bundle block each, who presents with right knee pain out of full, cough and shortness of breath.  Patient has dementia,  appears to be a poor historian and is unable to provide accurate medical history, therefore, most of the history is obtained by discussing the case with ED physician, per EMS report, and with the nursing staff. It seems that pt had a fall while he was walking in SNF. After tha0t, he developed pain over right knee joint. He could not put weight on R leg. He denies head injury, right hip pain, tingling sensations in right leg. He also has productive cough and shortness of breath. His oxygen desaturated to 88% on room air in the emergency room. He denies chest pain, fever or chills. He does not seem to have abdominal pain, nausea, vomiting, diarrhea, symptoms of UTI or unilateral weakness.  In ED, patient was found to have WBC 16.8, BNP 54.9, INR 1.14, PTT 35, pending urinalysis, temperature normal, tachycardia, tachypnea, electrolytes and renal function okay. Chest x-ray showed patchy infiltration in the left lower lobe, and increased the size of the right upper lobe mass. X-ray of her right knee is negative for bony abnormalities. X-ray R femur showed nondisplaced fractures of the greater trochanter of the femur. Patient's and admitted to inpatient for further eval and treatment. Michael Yang will be consulted by EDP.  EKG: Independently reviewed. QTC 468, old right bundle block each and a first degree AV block.  Where does patient live?   SNF  Can patient participate in ADLs?  none Review of Systems: Could not be reviewed accurately due to dementia.  Allergy:  Allergies  Allergen Reactions  . Hydrochlorothiazide Other (See Comments)    Hyponatremia per Nephrology    Past Medical History  Diagnosis Date  . Hypertension   . Angiospasm ([place])   . Diabetes mellitus   . Schizoaffective disorder   . Dementia   . Cataracts, bilateral   . Blindness   . Rhabdomyolysis   . Gastritis   . UTI (lower urinary tract infection)   . GI bleed   . Anemia   . Mass of lung 08/11/2014    Mass of upper lobe of right lung   . Syncope 06/03/2014  . 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 Prostate cancer ([place])   . Chronic diastolic (congestive) heart failure [facility])     Past Surgical History  Procedure Laterality Date  . Colonoscopy  08/08/2011    Procedure: COLONOSCOPY;  Surgeon: Lafayette Dragon, MD;  Location: [facility];  Service: Endoscopy;  Laterality: N/A;  Rm 1240  . Esophagogastroduodenoscopy  08/08/2011    Procedure: ESOPHAGOGASTRODUODENOSCOPY (EGD);  Surgeon: Lafayette Dragon, MD;  Location: [facility];  Service: Endoscopy;  Laterality: N/A;  . Abdominal surgery      patient does not remember what kind of surgery or why he had surgery    Social History:  reports that he has been smoking Cigarettes.  He has a 7 pack-year smoking history. He has quit using smokeless tobacco. He reports that he does not drink alcohol or use illicit drugs.  Family History:  No family history on file. could not be reviewed accurately due to dementia.  Prior to Admission medications   Medication Sig Start Date End Date Taking? Authorizing Provider  acetaminophen (TYLENOL 8 HOUR) 650 MG CR tablet Take 1 tablet (650 mg total) by mouth every 8 (eight) hours as needed for pain. 11/02/14  Yes Kaitlyn Szekalski, PA-C  acetaminophen (TYLENOL) 500 MG tablet Take 500 mg by mouth 3 (three) times daily.   Yes Historical Provider, MD  aspirin EC 81 MG tablet Take 81 mg by mouth  every morning.   Yes Historical Provider, MD  buPROPion (WELLBUTRIN SR) 100 MG 12 hr tablet Take 100 mg by mouth every morning.    Yes Historical Provider, MD  cholecalciferol (VITAMIN D) 400 UNITS TABS tablet Take 800 Units by mouth every morning.    Yes Historical Provider, MD  Ferrous Fumarate 324 MG TABS Take 1 tablet by mouth every morning.    Yes Historical Provider, MD  finasteride (PROSCAR) 5 MG tablet Take 5 mg by mouth every morning. *Do not crush.*   Yes Historical Provider, MD  fluticasone (FLONASE) 50 MCG/ACT nasal spray Place 1 spray into the nose every morning.    Yes Historical Provider, MD  furosemide (LASIX) 20 MG tablet Take 1 tablet (20 mg total) by mouth daily. Patient taking differently: Take 20 mg by mouth every morning.  06/05/14 06/05/15 Yes Thurnell Lose, MD  hydrALAZINE (APRESOLINE) 25 MG tablet Take 1 tablet (25 mg total) by mouth 3 (three) times daily. 06/05/14  Yes Thurnell Lose, MD  hydrocerin (EUCERIN) CREA Apply 1 application topically 2 (two) times daily. To legs and feet. (May keep at bedside.)   Yes Historical Provider, MD  Multiple Vitamins-Minerals (CERTAVITE/ANTIOXIDANTS PO) Take 1 tablet by mouth every morning.   Yes Historical Provider, MD  omeprazole (PRILOSEC) 40 MG capsule Take 40 mg by mouth every morning.   Yes Historical Provider, MD    Physical Exam: Filed Vitals:   04/08/2015 2022 04/30/2015 2027 04/13/2015 2128 04/29/2015 2254  BP: 162/95   137/81  Pulse: 85   100  Temp:    97.4 F (36.3 C)  TempSrc:    Oral  Resp: 26   18  SpO2: 88% 92% 96% 98%   General: Not in acute distress HEENT:       Eyes: EOMI, no scleral icterus.       ENT: No discharge from the ears and nose, no pharynx injection, no tonsillar enlargement.        Neck: No JVD, no bruit, no mass felt. Heme: No neck lymph node enlargement. Cardiac: S1/S2, RRR, tachycardia, No murmurs, No gallops or rubs. Pulm: has wheezing and rhonchi bilaterally, no rales or rubs. Abd: Soft,  nondistended, nontender, no rebound pain, no organomegaly, BS present. Ext: No pitting leg edema bilaterally. 2+DP/PT pulse bilaterally. Musculoskeletal: Has tenderness over right knee joints, no swelling in right knee. Mild tenderness over right hip  Skin: No rashes.  Neuro: Alert, oriented X3, cranial nerves II-XII grossly intact, moves all extremities. Psych: Patient is not psychotic, no suicidal or hemocidal ideation.  Labs on Admission:  Basic Metabolic Panel:  Recent Labs Lab 05/05/2015 2126  NA 139  K 4.0  CL 104  CO2 25  GLUCOSE 109*  BUN 22*  CREATININE 1.06  CALCIUM 9.8   Liver Function Tests:  Recent Labs Lab 05/04/2015 2126  AST 32  ALT 29  ALKPHOS 131*  BILITOT 0.4  PROT 7.5  ALBUMIN 3.5  No results for input(s): LIPASE, AMYLASE in the last 168 hours. No results for input(s): AMMONIA in the last 168 hours. CBC:  Recent Labs Lab 05/02/2015 2126  WBC 16.8*  HGB 13.8  HCT 43.6  MCV 98.4  PLT 294   Cardiac Enzymes: No results for input(s): CKTOTAL, CKMB, CKMBINDEX, TROPONINI in the last 168 hours.  BNP (last 3 results)  Recent Labs  06/03/14 1353 04/21/2015 2126  BNP 41.6 54.9    ProBNP (last 3 results) No results for input(s): PROBNP in the last 8760 hours.  CBG: No results for input(s): GLUCAP in the last 168 hours.  Radiological Exams on Admission: Dg Chest 2 View  04/28/2015  CLINICAL DATA:  Syncope. Known right upper lobe mass. History of prostate carcinoma EXAM: CHEST  2 VIEW COMPARISON:  Chest CT Aug 12, 2014 and chest radiograph November 02, 2014 FINDINGS: The previously noted mass in the right upper lobe has increased in size, currently measuring 6.2 x 5.3 x 4.7 cm in size. On the frontal view, there is suggestion of cavitation in this lesion. There is patchy infiltrate in the left lower lung zone region. Heart size is upper normal with pulmonary vascularity normal. No adenopathy demonstrable by radiography. There is degenerative change in the  thoracic spine. No blastic or lytic bone lesions are appreciable. There is evidence of old trauma involving the lateral right clavicle. IMPRESSION: Previously noted mass right upper lobe has increased in size with suggestion of cavitation. Chest CT, ideally with intravenous contrast, may be helpful for further assessment of this lesion. Patchy infiltrate left lower lobe. Stable cardiac silhouette. Electronically Signed   By: Lowella Grip III M.D.   On: 04/27/2015 21:40   Dg Knee Complete 4 Views Right  05/05/2015  CLINICAL DATA:  Status post fall, with right knee pain. Initial encounter. EXAM: RIGHT KNEE - COMPLETE 4+ VIEW COMPARISON:  None. FINDINGS: There is no evidence of fracture or dislocation. Medial compartment narrowing is noted. No additional degenerative change is seen; the patellofemoral joint is grossly unremarkable in appearance. No significant joint effusion is seen. The visualized soft tissues are normal in appearance. IMPRESSION: No evidence of fracture or dislocation. Medial compartment narrowing noted. Electronically Signed   By: Garald Balding M.D.   On: 05/07/2015 21:41   Dg Femur, Min 2 Views Right  04/26/2015  CLINICAL DATA:  82 year old male with fall and right knee pain. EXAM: RIGHT FEMUR 2 VIEWS COMPARISON:  None. FINDINGS: There is linear lucencies through the right trochanter compatible with nondisplaced fractures. CT may provide better evaluation. No other acute fracture identified, however ; evaluation is somewhat limited due to underlying osteopenia. There is no dislocation. The soft tissues appear unremarkable. No knee effusion. IMPRESSION: Nondisplaced fractures of the greater trochanter of the femur. Electronically Signed   By: Anner Crete M.D.   On: 04/08/2015 21:42    Assessment/Plan Principal Problem:   HCAP (healthcare-associated pneumonia) Active Problems:   HTN (hypertension)   Dementia   Mobitz (type) I (Wenckebach's) atrioventricular block   RBBB    Mass of lung   Tobacco abuse   Sepsis (HCC)   Chronic diastolic (congestive) heart failure (HCC)   Closed fracture of right hip (HCC)   Sepsis due to HCAP (healthcare-associated pneumonia): CXR showed patchy infiltration over left lower lobe. Patient is septic with leukocytosis, tachycardia, tachypnea. Hemodynamically stable.  - Will admit to Telemetry Bed - IV Vancomycin and cefepime.  - Mucinex for cough  - Atrovent, Xopenex Neb prn for  SOB - Urine legionella and S. pneumococcal antigen - Follow up blood culture x2, sputum culture, plus Flu pcr - will get Procalcitonin and trend lactic acid level per sepsis protocol - IVF: 2L of NS bolus in ED, followed by 75 mL per hour of NS - PCCM was consulted  Closed fracture of right hip (HCC) and right knee pain: No bony abnormalities by x-ray of the right knee. Patient has nondisplaced fracture of greater trochanter of the femur. He has right knee pain and mild tenderness over right hip joint.  No neurovascular compromise. Orthopedic surgeon was consulted.  - Pain control: morphine prn and percocet - follow up ortho recs - NPO after MN - type and cross - INR/PTT  HTN: -Continue oral hydralazine -Hold Lasix due to sepsis -IV hydralazine.  Hx of RUL lung mass: increased in size by CXR. Worrisome for bronchogenic carcinoma.  -will get CT-chest.  -may need to consult oncology or f/u with oncology  History of prostate cancer: Not clear about the history of treatment.  given his lung mass, metastasized disease is potential differential diagnosis. -Check PSA  Tobacco abuse: -Did counseling about importance of quitting smoking -Nicotine patch  Chronic diastolic (congestive) heart failure ([place]): 2-D echo on 06/04/14 showed EF 55-60 percent with grade 1 diastolic dysfunction. BNP 54.9. Patient does not have leg edema or JVD, CHF is compensated. -Hold Lasix due to sepsis -Continue aspirin.    DVT ppx: SCD  Code Status: Full code Family  Communication: None at bed side.   Disposition Plan: Admit to inpatient   Date of Service 30-Apr-2015    Ivor Costa [facility] Pager [phone]  If 7PM-7AM, please contact night-coverage [email or web address] Password [email or web address] 2015-04-30, 12:41 AM

## 2015-04-22 NOTE — ED Provider Notes (Addendum)
CSN: [id number]     Arrival date & time 04/29/2015  2007 History   First MD Initiated Contact with Patient 04/08/2015 2013     Chief Complaint  Patient presents with  . Fall    HPI Patient presents to the emergency room for evaluation of knee pain. Patient states he was walking at the nursing facility when he tripped and fell and landed on his right knee. He was brought in by ambulance to be evaluated. She denies any other injuries. He denies hitting his head. He denies loss of consciousness. Denies any numbness or weakness. Patient has had some coughing and congestion. He denies any trouble with chest pain or shortness of breath. Past Medical History  Diagnosis Date  . Hypertension   . Angiospasm ([place])   . Diabetes mellitus   . Schizoaffective disorder   . Dementia   . Cataracts, bilateral   . Blindness   . Rhabdomyolysis   . Gastritis   . UTI (lower urinary tract infection)   . GI bleed   . Anemia   . Mass of lung 08/11/2014    Mass of upper lobe of right lung   . Syncope 06/03/2014  . 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 Prostate cancer [facility])    Past Surgical History  Procedure Laterality Date  . Colonoscopy  08/08/2011    Procedure: COLONOSCOPY;  Surgeon: Lafayette Dragon, MD;  Location: [facility];  Service: Endoscopy;  Laterality: N/A;  Rm 1240  . Esophagogastroduodenoscopy  08/08/2011    Procedure: ESOPHAGOGASTRODUODENOSCOPY (EGD);  Surgeon: Lafayette Dragon, MD;  Location: [facility];  Service: Endoscopy;  Laterality: N/A;  . Abdominal surgery      patient does not remember what kind of surgery or why he had surgery   No family history on file. Social History  Substance Use Topics  . Smoking status: Current Every Day Smoker -- 0.10 packs/day for 70 years    Types: Cigarettes  . Smokeless tobacco: Former Systems developer  . Alcohol Use: No    Review of Systems  All other systems reviewed and are negative.     Allergies  Hydrochlorothiazide  Home Medications   Prior to Admission  medications   Medication Sig Start Date End Date Taking? Authorizing Provider  acetaminophen (TYLENOL 8 HOUR) 650 MG CR tablet Take 1 tablet (650 mg total) by mouth every 8 (eight) hours as needed for pain. 11/02/14  Yes Kaitlyn Szekalski, PA-C  acetaminophen (TYLENOL) 500 MG tablet Take 500 mg by mouth 3 (three) times daily.   Yes Historical Provider, MD  aspirin EC 81 MG tablet Take 81 mg by mouth every morning.   Yes Historical Provider, MD  buPROPion (WELLBUTRIN SR) 100 MG 12 hr tablet Take 100 mg by mouth every morning.    Yes Historical Provider, MD  cholecalciferol (VITAMIN D) 400 UNITS TABS tablet Take 800 Units by mouth every morning.    Yes Historical Provider, MD  Ferrous Fumarate 324 MG TABS Take 1 tablet by mouth every morning.    Yes Historical Provider, MD  finasteride (PROSCAR) 5 MG tablet Take 5 mg by mouth every morning. *Do not crush.*   Yes Historical Provider, MD  fluticasone (FLONASE) 50 MCG/ACT nasal spray Place 1 spray into the nose every morning.    Yes Historical Provider, MD  furosemide (LASIX) 20 MG tablet Take 1 tablet (20 mg total) by mouth daily. Patient taking differently: Take 20 mg by mouth every morning.  06/05/14 06/05/15 Yes Thurnell Lose, MD  hydrALAZINE (APRESOLINE) 25 MG tablet Take 1 tablet (25 mg total) by mouth 3 (three) times daily. 06/05/14  Yes Thurnell Lose, MD  hydrocerin (EUCERIN) CREA Apply 1 application topically 2 (two) times daily. To legs and feet. (May keep at bedside.)   Yes Historical Provider, MD  Multiple Vitamins-Minerals (CERTAVITE/ANTIOXIDANTS PO) Take 1 tablet by mouth every morning.   Yes Historical Provider, MD  omeprazole (PRILOSEC) 40 MG capsule Take 40 mg by mouth every morning.   Yes Historical Provider, MD   BP 137/81 mmHg  Pulse 100  Temp(Src) 97.4 F (36.3 C) (Oral)  Resp 18  SpO2 98% Physical Exam  Constitutional: No distress.  HENT:  Head: Normocephalic and atraumatic.  Right Ear: External ear normal.  Left Ear:  External ear normal.  Mouth/Throat: No oropharyngeal exudate.  Eyes: Conjunctivae are normal. Right eye exhibits no discharge. Left eye exhibits no discharge. No scleral icterus.  Neck: Neck supple. No tracheal deviation present.  Cardiovascular: Normal rate, regular rhythm and intact distal pulses.   Pulmonary/Chest: Effort normal. No stridor. No respiratory distress. He has wheezes. He has no rales.  Few crackles at the bases, slight wheeze noted him a decreased air movement  Abdominal: Soft. Bowel sounds are normal. He exhibits no distension. There is no tenderness. There is no rebound and no guarding.  Musculoskeletal: He exhibits tenderness. He exhibits no edema.       Right hip: Normal.       Left hip: Normal.       Right knee: He exhibits no swelling and no effusion. Tenderness found.       Cervical back: Normal.       Thoracic back: Normal.       Lumbar back: Normal.  Neurological: He is alert. He has normal strength. No cranial nerve deficit (no facial droop, extraocular movements intact, no slurred speech) or sensory deficit. He exhibits normal muscle tone. He displays no seizure activity. Coordination normal.  Skin: Skin is warm and dry. No rash noted.  Psychiatric: He has a normal mood and affect.  Nursing note and vitals reviewed.   ED Course  Procedures (including critical care time) Labs Review Labs Reviewed  CBC - Abnormal; Notable for the following:    WBC 16.8 (*)    All other components within normal limits  COMPREHENSIVE METABOLIC PANEL - Abnormal; Notable for the following:    Glucose, Bld 109 (*)    BUN 22 (*)    Alkaline Phosphatase 131 (*)    All other components within normal limits  BRAIN NATRIURETIC PEPTIDE  PROTIME-INR  APTT  URINALYSIS, ROUTINE W REFLEX MICROSCOPIC (NOT AT [facility])  Randolm Idol, ED    Imaging Review Dg Chest 2 View  04/18/2015  CLINICAL DATA:  Syncope. Known right upper lobe mass. History of prostate carcinoma EXAM: CHEST  2  VIEW COMPARISON:  Chest CT Aug 12, 2014 and chest radiograph November 02, 2014 FINDINGS: The previously noted mass in the right upper lobe has increased in size, currently measuring 6.2 x 5.3 x 4.7 cm in size. On the frontal view, there is suggestion of cavitation in this lesion. There is patchy infiltrate in the left lower lung zone region. Heart size is upper normal with pulmonary vascularity normal. No adenopathy demonstrable by radiography. There is degenerative change in the thoracic spine. No blastic or lytic bone lesions are appreciable. There is evidence of old trauma involving the lateral right clavicle. IMPRESSION: Previously noted mass right upper lobe has increased in  size with suggestion of cavitation. Chest CT, ideally with intravenous contrast, may be helpful for further assessment of this lesion. Patchy infiltrate left lower lobe. Stable cardiac silhouette. Electronically Signed   By: Lowella Grip III M.D.   On: 04/18/2015 21:40   Dg Knee Complete 4 Views Right  04/24/2015  CLINICAL DATA:  Status post fall, with right knee pain. Initial encounter. EXAM: RIGHT KNEE - COMPLETE 4+ VIEW COMPARISON:  None. FINDINGS: There is no evidence of fracture or dislocation. Medial compartment narrowing is noted. No additional degenerative change is seen; the patellofemoral joint is grossly unremarkable in appearance. No significant joint effusion is seen. The visualized soft tissues are normal in appearance. IMPRESSION: No evidence of fracture or dislocation. Medial compartment narrowing noted. Electronically Signed   By: Garald Balding M.D.   On: 05/06/2015 21:41   Dg Femur, Min 2 Views Right  04/15/2015  CLINICAL DATA:  82 year old male with fall and right knee pain. EXAM: RIGHT FEMUR 2 VIEWS COMPARISON:  None. FINDINGS: There is linear lucencies through the right trochanter compatible with nondisplaced fractures. CT may provide better evaluation. No other acute fracture identified, however ; evaluation is  somewhat limited due to underlying osteopenia. There is no dislocation. The soft tissues appear unremarkable. No knee effusion. IMPRESSION: Nondisplaced fractures of the greater trochanter of the femur. Electronically Signed   By: Anner Crete M.D.   On: 04/24/2015 21:42   I have personally reviewed and evaluated these images and lab results as part of my medical decision-making.   EKG Interpretation   Date/Time:  Sunday April 22 2015 20:40:09 EST Ventricular Rate:  89 PR Interval:  258 QRS Duration: 150 QT Interval:  385 QTC Calculation: 468 R Axis:   92 Text Interpretation:  Sinus rhythm Prolonged PR interval Consider left  atrial enlargement Nonspecific intraventricular conduction delay Minimal  ST depression, anterior leads ST depression V1-V3, suggest recording  posterior leads Since last tracing rate faster Confirmed by Remie Mathison  MD-J,  Noelani Harbach ([id number]) on 05/06/2015 8:44:28 PM      MDM   Final diagnoses:  Closed nondisplaced fracture of greater trochanter of right femur, initial encounter ([place])  Cavitary lesion of lung  HCAP (healthcare-associated pneumonia)    Patient had had a decreased oxygen saturation when he arrived. He has been coughing during his stay in the ED. Chest x-ray shows the possibility of pneumonia and he also has a cavitary lung lesion that is increasing in size. I've asked the patient if he knows of any lung cancer the patient denies that. His record from the nursing home indicates that he has malignant prostate cancer.  The patient's knee pain is actually related to a greater trochanter fracture of the femur.  I will consult with orthopedics in the medical service for admission. Patient has been started and a modest cover for healthcare associated pneumonia. CT scanning of the chest was ordered to evaluate further this cavitary lung lesion    Dorie Rank, MD 04/30/2015 2349  D/w Dr Rolena Infante.  He will see patient tomorrow.  I was notified that patient fell  off his bed. I rechecked the patient.  He denies any new areas of pain.  No ttp arms, legs with the exception of his known right hip injury.  Pt is alert and awake. No head injury  Dorie Rank, MD May 09, 2015 [phone]

## 2015-04-22 NOTE — ED Notes (Signed)
PoA's # [place]

## 2015-04-22 NOTE — ED Notes (Signed)
Per EMS report: pt from [facility]: pt was walking around and had a fall.  Pt reports he didn't hit his head and denies neck or back pain.  Pt reports right knee pain and is unable to bear weight at this time.  EMS did not note any deformities.  EMS put pt on 2L [place] d/t pt's O2 sat being around 88%. Pt is warm to the touch and a/o x 4. Pt hx dementia. Staff reports pt is at his baseline. EMS reports pt has a nonproductive cough but clear lung sounds.

## 2015-04-22 NOTE — ED Notes (Signed)
Pt was not successful in giving UA.

## 2015-04-22 NOTE — ED Notes (Signed)
Pt states he is unable to urinate still.

## 2015-04-23 ENCOUNTER — Encounter (HOSPITAL_COMMUNITY): Payer: Self-pay

## 2015-04-23 ENCOUNTER — Inpatient Hospital Stay (HOSPITAL_COMMUNITY): Payer: Medicare Other

## 2015-04-23 DIAGNOSIS — S72001A Fracture of unspecified part of neck of right femur, initial encounter for closed fracture: Secondary | ICD-10-CM

## 2015-04-23 DIAGNOSIS — S72114A Nondisplaced fracture of greater trochanter of right femur, initial encounter for closed fracture: Secondary | ICD-10-CM | POA: Insufficient documentation

## 2015-04-23 DIAGNOSIS — A419 Sepsis, unspecified organism: Secondary | ICD-10-CM | POA: Diagnosis present

## 2015-04-23 DIAGNOSIS — R6521 Severe sepsis with septic shock: Secondary | ICD-10-CM

## 2015-04-23 LAB — BLOOD GAS, ARTERIAL
Acid-base deficit: 6.3 mmol/L — ABNORMAL HIGH (ref 0.0–2.0)
Bicarbonate: 21.7 mEq/L (ref 20.0–24.0)
DRAWN BY: 422461
O2 Content: 5 L/min
O2 Saturation: 80 %
PH ART: 7.214 — AB (ref 7.350–7.450)
Patient temperature: 97.4
TCO2: 20.8 mmol/L (ref 0–100)
pCO2 arterial: 55.2 mmHg — ABNORMAL HIGH (ref 35.0–45.0)
pO2, Arterial: 55.5 mmHg — ABNORMAL LOW (ref 80.0–100.0)

## 2015-04-23 LAB — PROCALCITONIN

## 2015-04-23 LAB — TYPE AND SCREEN
ABO/RH(D): O POS
Antibody Screen: NEGATIVE

## 2015-04-23 LAB — LACTIC ACID, PLASMA: Lactic Acid, Venous: 1.9 mmol/L (ref 0.5–2.0)

## 2015-04-23 LAB — HIV ANTIBODY (ROUTINE TESTING W REFLEX): HIV SCREEN 4TH GENERATION: NONREACTIVE

## 2015-04-23 MED ORDER — FLUTICASONE PROPIONATE 50 MCG/ACT NA SUSP
1.0000 | Freq: Every day | NASAL | Status: DC
  Filled 2015-04-23: qty 16

## 2015-04-23 MED ORDER — OXYCODONE-ACETAMINOPHEN 5-325 MG PO TABS: 1.0000 | ORAL_TABLET | ORAL | Status: DC | PRN

## 2015-04-23 MED ORDER — LEVALBUTEROL HCL 0.63 MG/3ML IN NEBU: 0.6300 mg | INHALATION_SOLUTION | Freq: Four times a day (QID) | RESPIRATORY_TRACT | Status: DC | PRN

## 2015-04-23 MED ORDER — HYDROCERIN EX CREA
1.0000 "application " | TOPICAL_CREAM | Freq: Two times a day (BID) | CUTANEOUS | Status: DC
  Administered 2015-04-23: 1 via TOPICAL
  Filled 2015-04-23: qty 113

## 2015-04-23 MED ORDER — ADULT MULTIVITAMIN W/MINERALS CH: 1.0000 | ORAL_TABLET | Freq: Every day | ORAL | Status: DC

## 2015-04-23 MED ORDER — DEXTROSE 5 % IV SOLN
1.0000 g | Freq: Two times a day (BID) | INTRAVENOUS | Status: DC
  Filled 2015-04-23: qty 1

## 2015-04-23 MED ORDER — FINASTERIDE 5 MG PO TABS
5.0000 mg | ORAL_TABLET | Freq: Every day | ORAL | Status: DC
  Filled 2015-04-23: qty 1

## 2015-04-23 MED ORDER — NICOTINE 21 MG/24HR TD PT24: 21.0000 mg | MEDICATED_PATCH | Freq: Every day | TRANSDERMAL | Status: DC

## 2015-04-23 MED ORDER — SODIUM CHLORIDE 0.9 % IV SOLN
1000.0000 mL | INTRAVENOUS | Status: DC
  Administered 2015-04-23: 1000 mL via INTRAVENOUS

## 2015-04-23 MED ORDER — ACETAMINOPHEN 325 MG PO TABS: 650.0000 mg | ORAL_TABLET | Freq: Four times a day (QID) | ORAL | Status: DC | PRN

## 2015-04-23 MED ORDER — HALOPERIDOL LACTATE 5 MG/ML IJ SOLN: 5.0000 mg | INTRAMUSCULAR | Status: DC | PRN

## 2015-04-23 MED ORDER — CHOLECALCIFEROL 10 MCG (400 UNIT) PO TABS
800.0000 [IU] | ORAL_TABLET | Freq: Every day | ORAL | Status: DC
  Filled 2015-04-23: qty 2

## 2015-04-23 MED ORDER — LEVALBUTEROL HCL 1.25 MG/0.5ML IN NEBU: 1.2500 mg | INHALATION_SOLUTION | Freq: Four times a day (QID) | RESPIRATORY_TRACT | Status: DC

## 2015-04-23 MED ORDER — LEVALBUTEROL HCL 1.25 MG/0.5ML IN NEBU
1.2500 mg | INHALATION_SOLUTION | Freq: Four times a day (QID) | RESPIRATORY_TRACT | Status: DC
  Administered 2015-04-23: 1.25 mg via RESPIRATORY_TRACT
  Filled 2015-04-23: qty 0.5

## 2015-04-23 MED ORDER — METHYLPREDNISOLONE SODIUM SUCC 40 MG IJ SOLR
40.0000 mg | Freq: Four times a day (QID) | INTRAMUSCULAR | Status: DC
  Administered 2015-04-23: 40 mg via INTRAVENOUS
  Filled 2015-04-23: qty 1

## 2015-04-23 MED ORDER — ASPIRIN EC 81 MG PO TBEC
81.0000 mg | DELAYED_RELEASE_TABLET | Freq: Every morning | ORAL | Status: DC
  Filled 2015-04-23: qty 1

## 2015-04-23 MED ORDER — BUPROPION HCL ER (SR) 100 MG PO TB12
100.0000 mg | ORAL_TABLET | Freq: Every day | ORAL | Status: DC
  Filled 2015-04-23: qty 1

## 2015-04-23 MED ORDER — LEVALBUTEROL HCL 1.25 MG/0.5ML IN NEBU
1.2500 mg | INHALATION_SOLUTION | RESPIRATORY_TRACT | Status: DC
  Filled 2015-04-23: qty 0.5

## 2015-04-23 MED ORDER — IPRATROPIUM BROMIDE 0.02 % IN SOLN
0.5000 mg | RESPIRATORY_TRACT | Status: DC
  Administered 2015-04-23: 0.5 mg via RESPIRATORY_TRACT
  Filled 2015-04-23: qty 2.5

## 2015-04-23 MED ORDER — PANTOPRAZOLE SODIUM 40 MG PO TBEC: 40.0000 mg | DELAYED_RELEASE_TABLET | Freq: Every day | ORAL | Status: DC

## 2015-04-23 MED ORDER — DM-GUAIFENESIN ER 30-600 MG PO TB12
1.0000 | ORAL_TABLET | Freq: Two times a day (BID) | ORAL | Status: DC
  Administered 2015-04-23: 1 via ORAL
  Filled 2015-04-23 (×3): qty 1

## 2015-04-23 MED ORDER — IPRATROPIUM BROMIDE 0.02 % IN SOLN: 0.5000 mg | RESPIRATORY_TRACT | Status: DC

## 2015-04-23 MED ORDER — SODIUM CHLORIDE 0.9 % IV BOLUS (SEPSIS)
2000.0000 mL | Freq: Once | INTRAVENOUS | Status: AC
  Administered 2015-04-23: 2000 mL via INTRAVENOUS

## 2015-04-23 MED ORDER — HYDRALAZINE HCL 25 MG PO TABS
25.0000 mg | ORAL_TABLET | Freq: Three times a day (TID) | ORAL | Status: DC
  Filled 2015-04-23 (×3): qty 1

## 2015-04-23 MED ORDER — IOHEXOL 300 MG/ML  SOLN
75.0000 mL | Freq: Once | INTRAMUSCULAR | Status: AC | PRN
Start: 2015-04-23 — End: 2015-04-23
  Administered 2015-04-23: 75 mL via INTRAVENOUS

## 2015-04-23 MED ORDER — MORPHINE SULFATE (PF) 2 MG/ML IV SOLN: 2.0000 mg | INTRAVENOUS | Status: DC | PRN

## 2015-04-23 MED ORDER — FERROUS FUMARATE 325 (106 FE) MG PO TABS
1.0000 | ORAL_TABLET | Freq: Every day | ORAL | Status: DC
  Filled 2015-04-23: qty 1

## 2015-04-23 MED ORDER — IPRATROPIUM BROMIDE 0.02 % IN SOLN: 0.5000 mg | Freq: Four times a day (QID) | RESPIRATORY_TRACT | Status: DC

## 2015-04-24 LAB — PSA, TOTAL AND FREE
PSA FREE: 2.23 ng/mL
PSA, Free Pct: 33.3 %
Prostate Specific Ag, Serum: 6.7 ng/mL — ABNORMAL HIGH (ref 0.0–4.0)

## 2015-04-28 LAB — CULTURE, BLOOD (ROUTINE X 2)
CULTURE: NO GROWTH
CULTURE: NO GROWTH

## 2015-05-09 DIAGNOSIS — 419620001 Death: Secondary | SNOMED CT | POA: Diagnosis present

## 2015-05-09 NOTE — ED Notes (Signed)
RT paged for breathing tx

## 2015-05-09 NOTE — ED Notes (Addendum)
MD aware of pt's O2 sat.  Having difficulty getting a good waveform for pt's O2 sat. Pt's O2 sensor changed and pt has been re-positioned. Pt was offered a non re-breather mask and pt pulled off the mask. Pt is currently very restless in bed.

## 2015-05-09 NOTE — Progress Notes (Signed)
eLink Physician-Brief Progress Note Patient Name: Michael Yang DOB: [birth year] MRN: [id number]   Date of Service  05-19-15  HPI/Events of Note  Contacted by Dr. Blaine Hamper regarding patient's tenuous respiratory status & multiple medical problems on admission. Reviewed chest CT with necrotic appearing RUL mass with ipsilateral hilar LAD & hepatic lesion worrisome for metastasis. Patient also had hypoxic & hypercarbic respiratory failure. Has altered mentation making it difficult to treat him. Baseline has dementia & lives at a nursing facility. Melba Owens Shark (Legal Guardian) contacted by me this morning and had a lengthy discussion via phone regarding the patient's wishes & prognosis. She agreed that if the patient continued to deteriorate we would not intubate or attempt resuscitation. She is going to notify other family of his clinical status. I contacted Dr. Blaine Hamper & made him aware of this conversation as well as our preliminary recommendations below.  eICU Interventions  1. DNR/DNI 2. Changing Xopenex & Atrovent to q4hr scheduled 3. Starting Solu-Medrol '40mg'$  IV q6hr 4. Continue broad spectrum antibiotics 5. Haldol IV prn for agitation 6. Consider BiPAP if his agitation improves on Haldol & he can tolerate to treat his hypercarbia     Intervention Category Evaluation Type: New Patient Evaluation  Michael Yang 2015/05/19, 5:01 AM

## 2015-05-09 NOTE — Discharge Summary (Signed)
Physician Discharge Summary  Michael Yang KZS:[id number] DOB: May 17, 1923 DOA: 05/08/2015  PCP: Reymundo Poll, MD  Admit date: 04/11/2015 Discharge date: 2015-05-23  Time spent: 35 minutes  Recommendations for Outpatient Follow-up: no follow-up since patient passed away  Discharge Diagnoses:  Principal Problem:   HCAP (healthcare-associated pneumonia) Active Problems:   HTN (hypertension)   Dementia   Mobitz (type) I (Wenckebach's) atrioventricular block   RBBB   Mass of lung   Tobacco abuse   Sepsis ([place])   Chronic diastolic (congestive) heart failure (HCC)   Closed fracture of right hip [facility])   Discharge Condition: Death  Diet recommendation: None  Filed Weights   2015-05-23 0538 23-May-2015 0543  Weight: 62.3 kg (137 lb 5.6 oz) 62.143 kg (137 lb)    History of present illness: 82 y.o. male with PMH of dementia, poor hearing, poor vision, hypertension, angioedema, GERD, depression, schizophrenia, GI bleeding, right upper lobe lung mass, prostate cancer, right bundle block each, who presents with right knee pain out of full, cough and shortness of breath.  Patient has dementia, appears to be a poor historian and is unable to provide accurate medical history, therefore, most of the history is obtained by discussing the case with ED physician, per EMS report, and with the nursing staff. It seems that pt had a fall while he was walking in SNF. After tha0t, he developed pain over right knee joint. He could not put weight on R leg. He denies head injury, right hip pain, tingling sensations in right leg. He also has productive cough and shortness of breath. His oxygen desaturated to 88% on room air in the emergency room. He denies chest pain, fever or chills. He does not seem to have abdominal pain, nausea, vomiting, diarrhea, symptoms of UTI or unilateral weakness.  In ED, patient was found to have WBC 16.8, BNP 54.9, INR 1.14, PTT 35, pending urinalysis, temperature normal, tachycardia,  tachypnea, electrolytes and renal function okay. Chest x-ray showed patchy infiltration in the left lower lobe, and increased the size of the right upper lobe mass. X-ray of her right knee is negative for bony abnormalities. X-ray R femur showed nondisplaced fractures of the greater trochanter of the femur.   Hospital Course:   Sepsis shock due to HCAP (healthcare-associated pneumonia): CXR showed patchy infiltration over left lower lobe. Patient is septic with leukocytosis, tachycardia, tachypnea. Initially hemodynamically stable, but it deteriorated quickly, developed septic shock. PCCM was consulted. Dr. Ashok Cordia spoke with family who agreed with DNR. Patient did not respond to antibiotics (vancomycin and cefepime) and aggressive IV fluid treatment. Patient died peacefully at 5:29 AM. I called his POA, Ms. Owens Shark and explained that we did everything we could, but patient did not respond to the treatment. Family is very reasonable and appreciated our efforts. Death certificate was signed by me.  Closed fracture of right hip ([employer]) and right knee pain: No bony abnormalities by x-ray of the right knee. Patient has nondisplaced fracture of greater trochanter of the femur. He has right knee pain and mild tenderness over right hip joint. No neurovascular compromise. Orthopedic surgeon was consulted. Unfortunately patient passed away before surgeon sow him.  HTN: Patient was treated with oral hydralazine and IV hydralazine prn.  Hx of RUL lung mass: increased in size by CXR. Worrisome for bronchogenic carcinoma. CT-chest showed interval increase in the size of the right upper lobe mass. Emphysema with diffuse airspace ground-glass opacity and patchy areas of subpleural densities in the right upper and right lower lobe  concerning for superimposed pneumonia. Right paratracheal adenopathy, new from prior study. Low attenuating nodular density posterior to the esophagus likely represents an enlarged lymph node and  new from prior study. Right hepatic enhancing lesion suspicious for metastatic disease.  History of prostate cancer: Not clear about the history of treatment. given his lung mass, metastasized disease is potential differential diagnosis. - PSA was ordered  Tobacco abuse: Did counseling about importance of quitting smoking and treated with nicotine patch  Chronic diastolic (congestive) heart failure ([place]): 2-D echo on 06/04/14 showed EF55-60 percent with grade 1 diastolic dysfunction. BNP 54.9. Patient does not have leg edema or JVD, CHF is compensated. held Lasix due to sepsis and continued aspirin.  Procedures:  None  Consultations:  [facility] and ortho surgeon  Discharge Exam: Filed Vitals:   01-May-2015 0518 2015-05-01 0526  BP: 66/50 54/22  Pulse: 59   Temp:    Resp: 16 8    General:  Patient passed away Cardiovascular: no heart beat Respiratory: stopped breathing  Discharge Instructions: None since patient passed away   Current Discharge Medication List    CONTINUE these medications which have NOT CHANGED   Details  acetaminophen (TYLENOL 8 HOUR) 650 MG CR tablet Take 1 tablet (650 mg total) by mouth every 8 (eight) hours as needed for pain. Qty: 30 tablet, Refills: 0    acetaminophen (TYLENOL) 500 MG tablet Take 500 mg by mouth 3 (three) times daily.    aspirin EC 81 MG tablet Take 81 mg by mouth every morning.    buPROPion (WELLBUTRIN SR) 100 MG 12 hr tablet Take 100 mg by mouth every morning.     cholecalciferol (VITAMIN D) 400 UNITS TABS tablet Take 800 Units by mouth every morning.     Ferrous Fumarate 324 MG TABS Take 1 tablet by mouth every morning.     finasteride (PROSCAR) 5 MG tablet Take 5 mg by mouth every morning. *Do not crush.*    fluticasone (FLONASE) 50 MCG/ACT nasal spray Place 1 spray into the nose every morning.     furosemide (LASIX) 20 MG tablet Take 1 tablet (20 mg total) by mouth daily. Qty: 30 tablet, Refills: 0    hydrALAZINE (APRESOLINE)  25 MG tablet Take 1 tablet (25 mg total) by mouth 3 (three) times daily. Qty: 90 tablet, Refills: 0    hydrocerin (EUCERIN) CREA Apply 1 application topically 2 (two) times daily. To legs and feet. (May keep at bedside.)    Multiple Vitamins-Minerals (CERTAVITE/ANTIOXIDANTS PO) Take 1 tablet by mouth every morning.    omeprazole (PRILOSEC) 40 MG capsule Take 40 mg by mouth every morning.       Allergies  Allergen Reactions  . Hydrochlorothiazide Other (See Comments)    Hyponatremia per Nephrology      The results of significant diagnostics from this hospitalization (including imaging, microbiology, ancillary and laboratory) are listed below for reference.    Significant Diagnostic Studies: Dg Chest 2 View  04/25/2015  CLINICAL DATA:  Syncope. Known right upper lobe mass. History of prostate carcinoma EXAM: CHEST  2 VIEW COMPARISON:  Chest CT Aug 12, 2014 and chest radiograph November 02, 2014 FINDINGS: The previously noted mass in the right upper lobe has increased in size, currently measuring 6.2 x 5.3 x 4.7 cm in size. On the frontal view, there is suggestion of cavitation in this lesion. There is patchy infiltrate in the left lower lung zone region. Heart size is upper normal with pulmonary vascularity normal. No adenopathy demonstrable  by radiography. There is degenerative change in the thoracic spine. No blastic or lytic bone lesions are appreciable. There is evidence of old trauma involving the lateral right clavicle. IMPRESSION: Previously noted mass right upper lobe has increased in size with suggestion of cavitation. Chest CT, ideally with intravenous contrast, may be helpful for further assessment of this lesion. Patchy infiltrate left lower lobe. Stable cardiac silhouette. Electronically Signed   By: Lowella Grip III M.D.   On: 04/21/2015 21:40   Ct Chest W Contrast  04/24/15  CLINICAL DATA:  82 year old male right upper lobe lung mass presenting with productive cough and  shortness of breath. EXAM: CT CHEST WITH CONTRAST TECHNIQUE: Multidetector CT imaging of the chest was performed during intravenous contrast administration. CONTRAST:  98m OMNIPAQUE IOHEXOL 300 MG/ML  SOLN COMPARISON:  Chest radiograph dated 05/08/2015 and CT dated 08/12/2014 FINDINGS: Evaluation is limited due to streak artifact caused by patient's arms. There is a centrally necrotic 4.2 x 5.1 cm (previously 2.9 x 3.2 cm) right upper lobe mass. A 4 mm nodule is noted in the right apex (series 3 8). There is paraseptal as well as centrilobular emphysema. There are bibasilar patchy atelectatic changes. Focal area of consolidation in the right upper lobe along the right major fissure car sponsored the previously seen subpleural bleb and concerning for superimposed infection. There is diffuse interstitial prominence and diffuse ground-glass airspace density predominantly involving the right lung. Small bilateral pleural effusions. No pneumothorax. The central airways are patent. There is atherosclerotic calcification of the thoracic aorta. The central pulmonary arteries are patent. Dense coronary vascular calcification. There is no cardiomegaly or pericardial effusion. Right paratracheal adenopathy measures 1.4 cm in short axis and increased compared to prior study. A 1.9 x 1.4 cm hypodense nodular density posterior to the esophagus at the level of the carina (series 3, image 23 is new from prior study and may represent an enlarged lymph node. There is prominence of the left mediastinal peribronchial soft tissues. There is no axillary adenopathy. The chest wall soft tissues appear unremarkable. A 1.8 x 1.9 cm ill-defined enhancing lesion in the right lobe of the liver likely represents metastatic disease. Stable appearing indeterminate 1.5 cm left renal upper pole hypodense lesion. Nonemergent Ultrasound or MRI may provide better characterization. There is osteopenia with diffuse degenerative changes of the spine. No  acute fracture. Stable appearing subcentimeter T11 sclerotic focus IMPRESSION: Interval increase in the size of the right upper lobe mass. Emphysema with diffuse airspace ground-glass opacity and patchy areas of subpleural densities in the right upper and right lower lobe concerning for superimposed pneumonia. Right paratracheal adenopathy, new from prior study. Low attenuating nodular density posterior to the esophagus likely represents an enlarged lymph node and new from prior study. Right hepatic enhancing lesion suspicious for metastatic disease. Electronically Signed   By: AAnner CreteM.D.   On: 02017/04/1701:22   Dg Knee Complete 4 Views Right  05/05/2015  CLINICAL DATA:  Status post fall, with right knee pain. Initial encounter. EXAM: RIGHT KNEE - COMPLETE 4+ VIEW COMPARISON:  None. FINDINGS: There is no evidence of fracture or dislocation. Medial compartment narrowing is noted. No additional degenerative change is seen; the patellofemoral joint is grossly unremarkable in appearance. No significant joint effusion is seen. The visualized soft tissues are normal in appearance. IMPRESSION: No evidence of fracture or dislocation. Medial compartment narrowing noted. Electronically Signed   By: JGarald BaldingM.D.   On: 04/17/2015 21:41   Dg Femur, Min 2  Views Right  04/20/2015  CLINICAL DATA:  82 year old male with fall and right knee pain. EXAM: RIGHT FEMUR 2 VIEWS COMPARISON:  None. FINDINGS: There is linear lucencies through the right trochanter compatible with nondisplaced fractures. CT may provide better evaluation. No other acute fracture identified, however ; evaluation is somewhat limited due to underlying osteopenia. There is no dislocation. The soft tissues appear unremarkable. No knee effusion. IMPRESSION: Nondisplaced fractures of the greater trochanter of the femur. Electronically Signed   By: Anner Crete M.D.   On: 04/21/2015 21:42    Microbiology: No results found for this or any  previous visit (from the past 240 hour(s)).   Labs: Basic Metabolic Panel:  Recent Labs Lab 04/28/2015 2126  NA 139  K 4.0  CL 104  CO2 25  GLUCOSE 109*  BUN 22*  CREATININE 1.06  CALCIUM 9.8   Liver Function Tests:  Recent Labs Lab 05/06/2015 2126  AST 32  ALT 29  ALKPHOS 131*  BILITOT 0.4  PROT 7.5  ALBUMIN 3.5   No results for input(s): LIPASE, AMYLASE in the last 168 hours. No results for input(s): AMMONIA in the last 168 hours. CBC:  Recent Labs Lab 05/04/2015 2126  WBC 16.8*  HGB 13.8  HCT 43.6  MCV 98.4  PLT 294   Cardiac Enzymes: No results for input(s): CKTOTAL, CKMB, CKMBINDEX, TROPONINI in the last 168 hours. BNP: BNP (last 3 results)  Recent Labs  06/03/14 1353 04/30/2015 2126  BNP 41.6 54.9    ProBNP (last 3 results) No results for input(s): PROBNP in the last 8760 hours.  CBG: No results for input(s): GLUCAP in the last 168 hours.     Signed:  Ivor Costa  [facility] 2015-04-28, 7:34 AM

## 2015-05-09 NOTE — Progress Notes (Addendum)
ANTIBIOTIC CONSULT NOTE - INITIAL  Pharmacy Consult for Vancomycin/Cefepime Indication: HCAP  Allergies  Allergen Reactions  . Hydrochlorothiazide Other (See Comments)    Hyponatremia per Nephrology    Patient Measurements:   Wt=62 kg  Vital Signs: Temp: 97.4 F (36.3 C) (01/15 2254) Temp Source: Oral (01/15 2254) BP: 137/81 mmHg (01/15 2254) Pulse Rate: 100 (01/15 2254) Intake/Output from previous day:   Intake/Output from this shift:    Labs:  Recent Labs  05/07/2015 2126  WBC 16.8*  HGB 13.8  PLT 294  CREATININE 1.06   CrCl cannot be calculated (Unknown ideal weight.). No results for input(s): VANCOTROUGH, VANCOPEAK, VANCORANDOM, GENTTROUGH, GENTPEAK, GENTRANDOM, TOBRATROUGH, TOBRAPEAK, TOBRARND, AMIKACINPEAK, AMIKACINTROU, AMIKACIN in the last 72 hours.   Microbiology: No results found for this or any previous visit (from the past 720 hour(s)).  Medical History: Past Medical History  Diagnosis Date  . Hypertension   . Angiospasm ([place])   . Diabetes mellitus   . Schizoaffective disorder   . Dementia   . Cataracts, bilateral   . Blindness   . Rhabdomyolysis   . Gastritis   . UTI (lower urinary tract infection)   . GI bleed   . Anemia   . Mass of lung 08/11/2014    Mass of upper lobe of right lung   . Syncope 06/03/2014  . 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 Prostate cancer ([place])   . Chronic diastolic (congestive) heart failure (HCC)     Medications:   (Not in a hospital admission) Scheduled:   Infusions:  . sodium chloride 1,000 mL (04/19/2015 2125)  . ceFEPime (MAXIPIME) IV    . vancomycin     Assessment: 82 yo s/p fall c/o nonproductive cough.  Vancomycin/Cefepime per Rx for PNA.  Goal of Therapy:  Vancomycin trough level 15-20 mcg/ml  Plan:   Vancomycin '500mg'$  IV q12h  Cefepime 1Gm IV q12h  F/u Scr/cultures/levels as needed  Lawana Pai R April 25, 2015,12:16 AM

## 2015-05-09 NOTE — ED Notes (Signed)
Noticed pt's HR was decreased to the 05W and systolic BP was in the 97X.  Dr. Blaine Hamper made aware of pt's abnormal VS.  Dr. Blaine Hamper states pt is a DNR/DNI.  Pt's condition continued to declined until no spontaneous breathing was noted nor was there a pulse.  Dr. Blaine Hamper pronounced pt dead at 05:29.

## 2015-05-09 NOTE — ED Notes (Signed)
Pt was found on the ground by staff.  Pt reports he was trying to get some water and got out of bed.  Agricultural consultant and MD notified.  No obvious injury was noted on pt. Pt denies any new pain.  Pt placed back in bed with a bed alarm.

## 2015-05-09 DEATH — deceased

## 2016-05-07 IMAGING — CT CT HEAD W/O CM
1 series · 16 of 30 positions shown, 20 images · non-contrast
Comparison: None.

CLINICAL DATA: Syncope, vomiting, bradycardia

EXAM:
CT HEAD WITHOUT CONTRAST
TECHNIQUE: Contiguous axial images were obtained from the base of the skull
through the vertex without intravenous contrast.

[Series 2: head 5.0 h30s · axial · 0.43mm/px · z∈[+1072,+1212]mm · 16 of 32 slices shown, 20 images]
[im 2/32  brain]
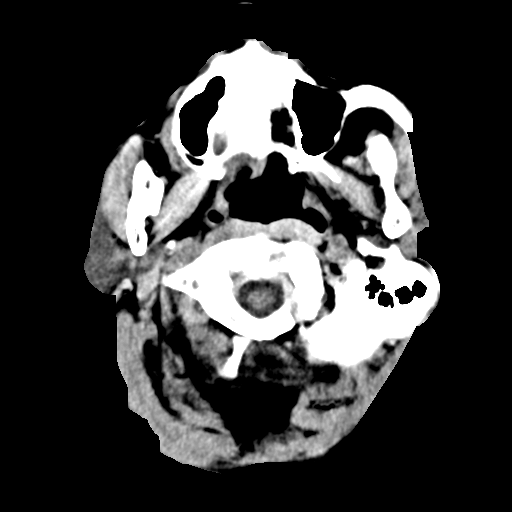
[im 2/32  bone]
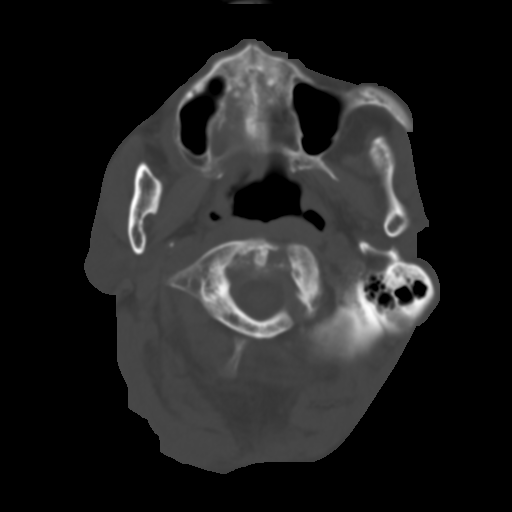
[im 4/32  brain]
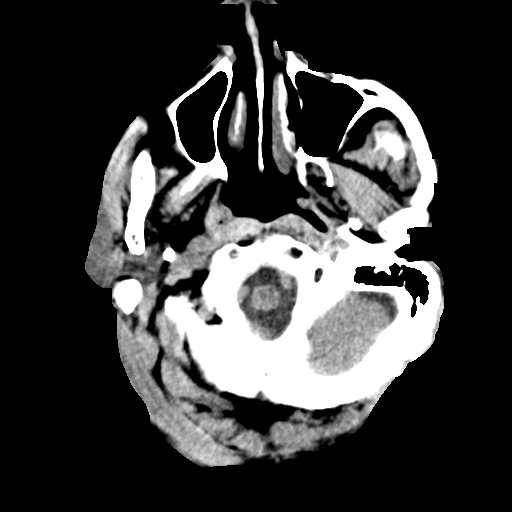
[im 6/32  brain]
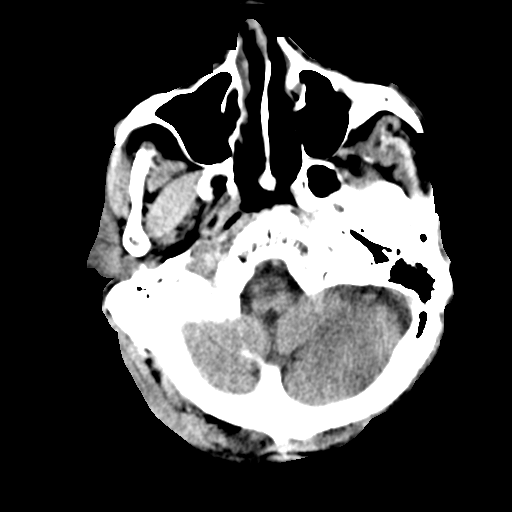
[im 8/32  brain]
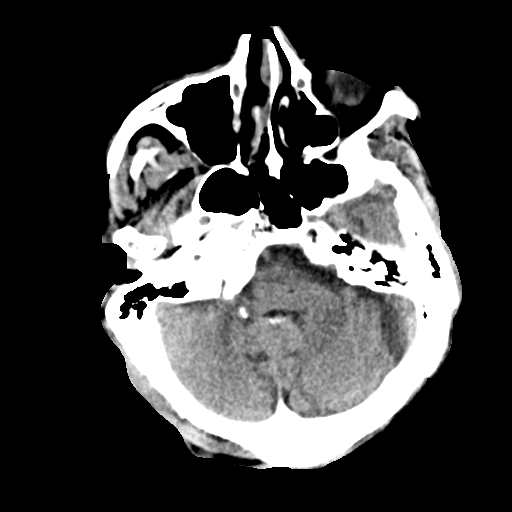
[im 9/32  brain]
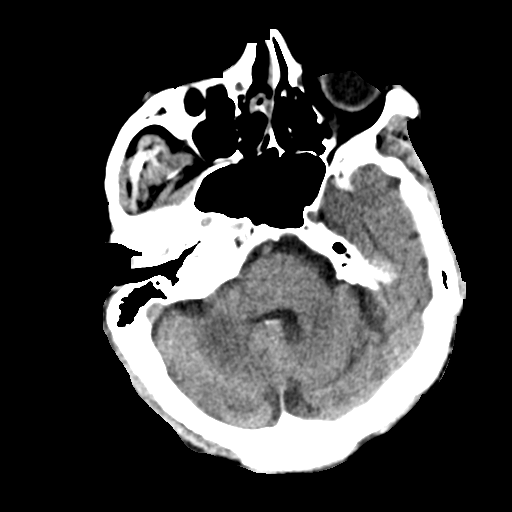
[im 9/32  bone]
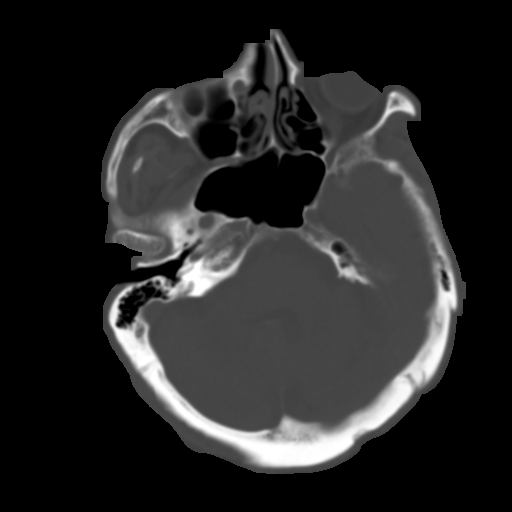
[im 11/32  brain]
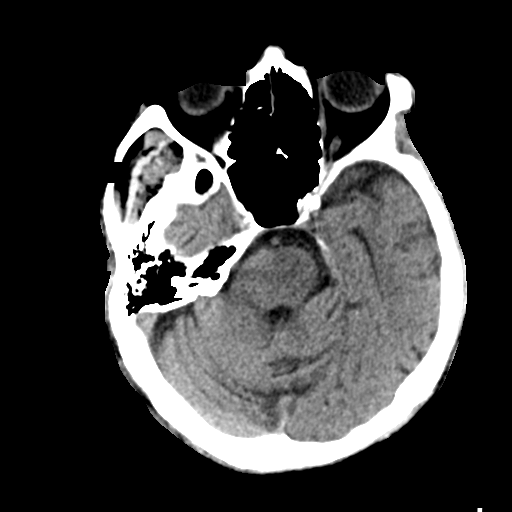
[im 13/32  brain]
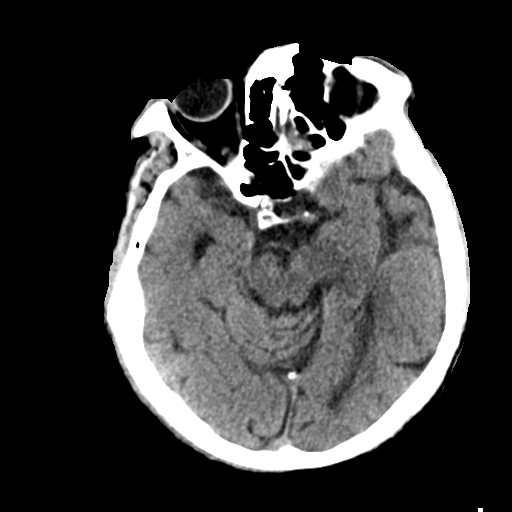
[im 15/32  brain]
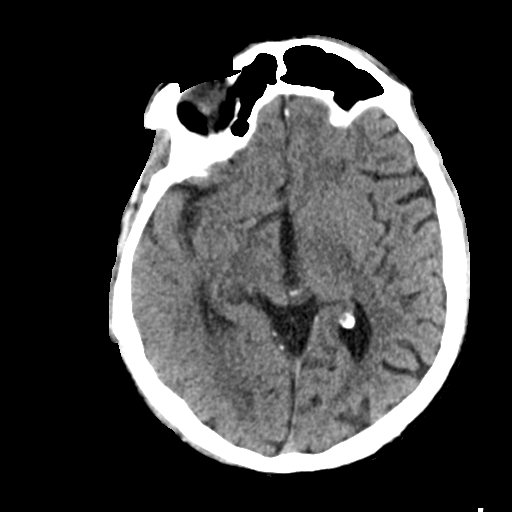
[im 17/32  brain]
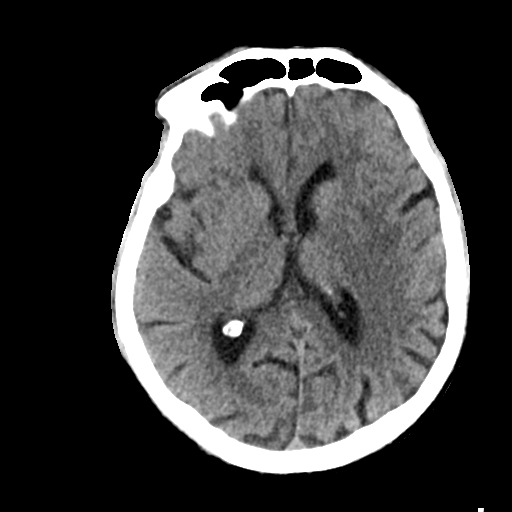
[im 17/32  bone]
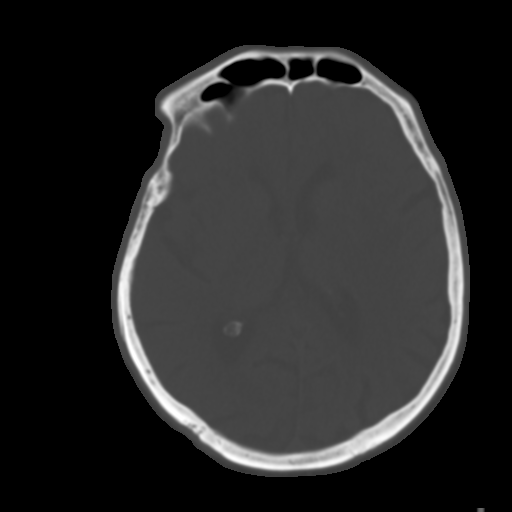
[im 19/32  brain]
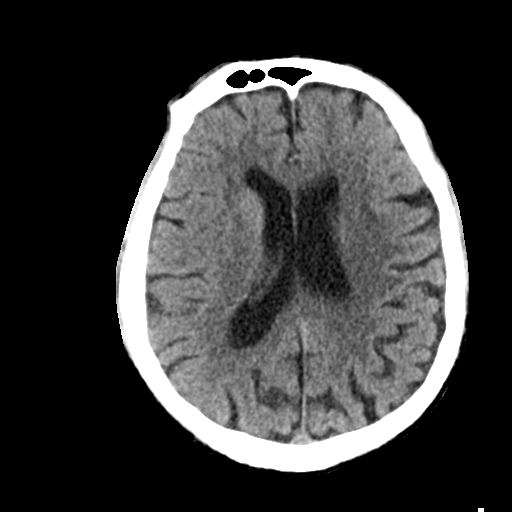
[im 21/32  brain]
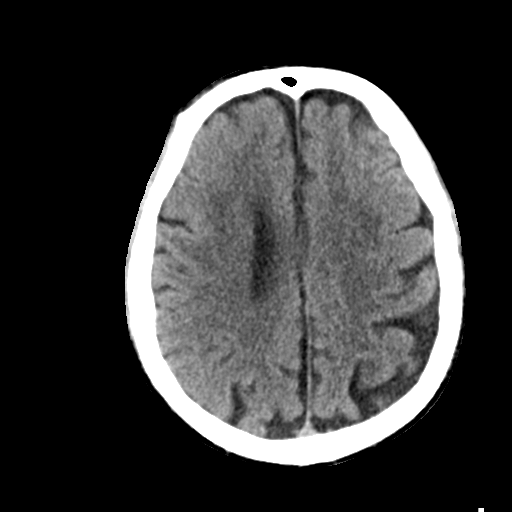
[im 23/32  brain]
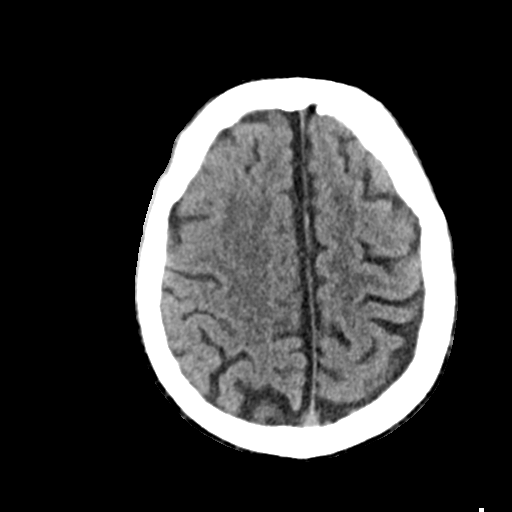
[im 24/32  brain]
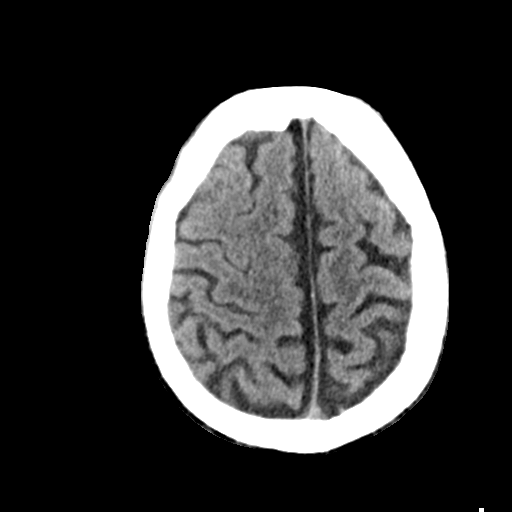
[im 24/32  bone]
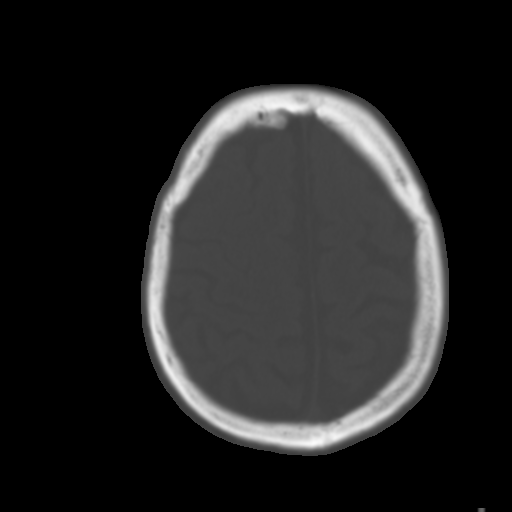
[im 26/32  brain]
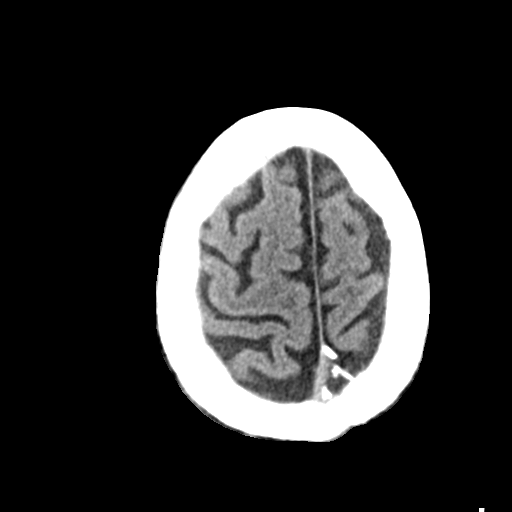
[im 28/32  brain]
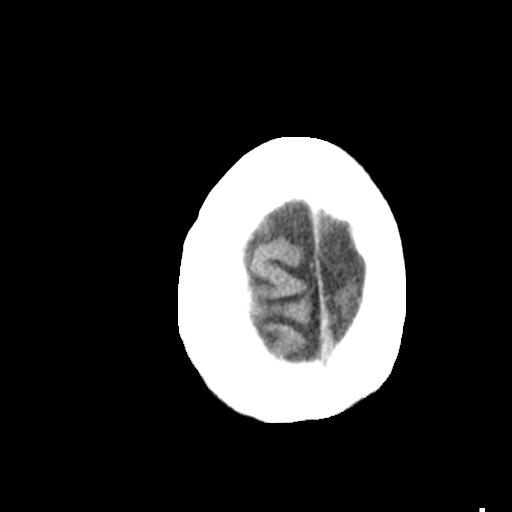
[im 30/32  brain]
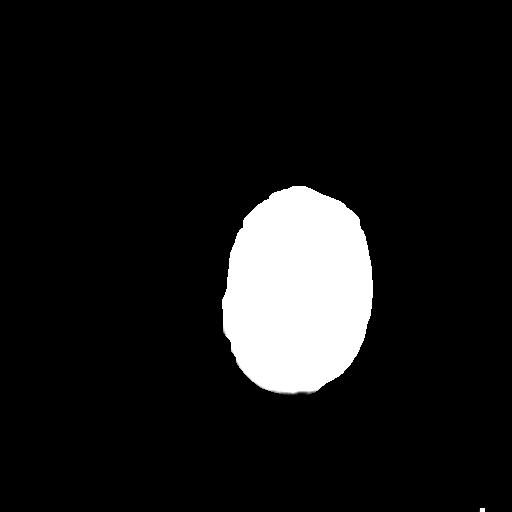

[16 of 30 positions shown; findings below may reference images not displayed]

FINDINGS: No evidence of parenchymal hemorrhage or extra-axial fluid
collection. No mass lesion, mass effect, or midline shift.

No CT evidence of acute infarction.

Subcortical white matter and periventricular small vessel ischemic
changes. Intracranial atherosclerosis.

Cerebral volume is within normal limits.  No ventriculomegaly.

The visualized paranasal sinuses are essentially clear. Partial
opacification of the right mastoid air cells.

No evidence of calvarial fracture.
IMPRESSION: No evidence of acute intracranial abnormality.

Small vessel ischemic changes with intracranial atherosclerosis.
